# Patient Record
Sex: Female | Born: 1960 | Race: White | Hispanic: No | Marital: Married | Smoking: Former smoker
Health system: Southern US, Community
[De-identification: ages and names within clinical notes are randomized; demographics above are authoritative.]

## PROBLEM LIST (undated history)

## (undated) DIAGNOSIS — E0590 Thyrotoxicosis, unspecified without thyrotoxic crisis or storm: Secondary | ICD-10-CM

## (undated) DIAGNOSIS — E785 Hyperlipidemia, unspecified: Secondary | ICD-10-CM

## (undated) HISTORY — PX: BUNIONECTOMY: SHX129

## (undated) HISTORY — DX: Hyperlipidemia, unspecified: E78.5

## (undated) HISTORY — DX: Thyrotoxicosis, unspecified without thyrotoxic crisis or storm: E05.90

---

## 1999-01-31 ENCOUNTER — Other Ambulatory Visit: Admission: RE | Admit: 1999-01-31 | Discharge: 1999-01-31 | Payer: Self-pay | Admitting: Obstetrics and Gynecology

## 1999-02-13 ENCOUNTER — Other Ambulatory Visit: Admission: RE | Admit: 1999-02-13 | Discharge: 1999-02-13 | Payer: Self-pay | Admitting: Obstetrics and Gynecology

## 1999-05-31 ENCOUNTER — Other Ambulatory Visit: Admission: RE | Admit: 1999-05-31 | Discharge: 1999-05-31 | Payer: Self-pay | Admitting: Obstetrics and Gynecology

## 1999-08-31 ENCOUNTER — Other Ambulatory Visit: Admission: RE | Admit: 1999-08-31 | Discharge: 1999-08-31 | Payer: Self-pay | Admitting: Obstetrics and Gynecology

## 1999-11-29 ENCOUNTER — Other Ambulatory Visit: Admission: RE | Admit: 1999-11-29 | Discharge: 1999-11-29 | Payer: Self-pay | Admitting: Obstetrics and Gynecology

## 2000-06-03 ENCOUNTER — Other Ambulatory Visit: Admission: RE | Admit: 2000-06-03 | Discharge: 2000-06-03 | Payer: Self-pay | Admitting: Obstetrics and Gynecology

## 2000-12-02 ENCOUNTER — Other Ambulatory Visit: Admission: RE | Admit: 2000-12-02 | Discharge: 2000-12-02 | Payer: Self-pay | Admitting: Obstetrics and Gynecology

## 2001-12-03 ENCOUNTER — Other Ambulatory Visit: Admission: RE | Admit: 2001-12-03 | Discharge: 2001-12-03 | Payer: Self-pay | Admitting: Obstetrics and Gynecology

## 2006-06-13 ENCOUNTER — Encounter: Admission: RE | Admit: 2006-06-13 | Discharge: 2006-06-13 | Payer: Self-pay | Admitting: Emergency Medicine

## 2009-06-13 ENCOUNTER — Encounter: Admission: RE | Admit: 2009-06-13 | Discharge: 2009-06-13 | Payer: Self-pay | Admitting: Emergency Medicine

## 2009-11-20 ENCOUNTER — Encounter: Admission: RE | Admit: 2009-11-20 | Discharge: 2009-11-20 | Payer: Self-pay | Admitting: Family Medicine

## 2012-08-10 ENCOUNTER — Other Ambulatory Visit: Payer: Self-pay | Admitting: Gastroenterology

## 2012-08-10 ENCOUNTER — Ambulatory Visit
Admission: RE | Admit: 2012-08-10 | Discharge: 2012-08-10 | Disposition: A | Discharge status: Home / Self Care | Payer: 59 | Source: Ambulatory Visit | Attending: Gastroenterology | Admitting: Gastroenterology

## 2012-08-10 DIAGNOSIS — Q438 Other specified congenital malformations of intestine: Secondary | ICD-10-CM

## 2016-03-05 ENCOUNTER — Encounter: Payer: Self-pay | Admitting: Endocrinology

## 2016-03-05 ENCOUNTER — Ambulatory Visit (INDEPENDENT_AMBULATORY_CARE_PROVIDER_SITE_OTHER): Payer: 59 | Admitting: Endocrinology

## 2016-03-05 VITALS — BP 118/70 | HR 62 | Temp 98.1°F | Ht 62.0 in | Wt 191.5 lb

## 2016-03-05 DIAGNOSIS — E0590 Thyrotoxicosis, unspecified without thyrotoxic crisis or storm: Secondary | ICD-10-CM | POA: Diagnosis not present

## 2016-03-05 DIAGNOSIS — E785 Hyperlipidemia, unspecified: Secondary | ICD-10-CM

## 2016-03-05 NOTE — Progress Notes (Signed)
Pre visit review using our clinic review tool, if applicable. No additional management support is needed unless otherwise documented below in the visit note. 

## 2016-03-05 NOTE — Progress Notes (Signed)
Subjective:    Patient ID: Amy Cowan, female    DOB: [birth year], 55 y.o.   MRN: [id number]  HPI Pt was noted on routine labs in early 2017, to have a suppressed TSH (she says it was normal in 2015).  she has no previous thyroid hx.  she has never had XRT to the anterior neck, or thyroid surgery.  she has never had thyroid imaging.  she does not consume kelp or any other prescribed or non-prescribed thyroid medication.  she has never been on amiodarone.  She reports slight arthralgias throughout the body, and assoc fatigue.  She has had TL. Past Medical History  Diagnosis Date  . Hyperthyroidism   . Dyslipidemia     No past surgical history on file.  Social History   Social History  . Marital Status: Single    Spouse Name: N/A  . Number of Children: N/A  . Years of Education: N/A   Occupational History  . Not on file.   Social History Main Topics  . Smoking status: Not on file  . Smokeless tobacco: Not on file  . Alcohol Use: Not on file  . Drug Use: Not on file  . Sexual Activity: Not on file   Other Topics Concern  . Not on file   Social History Narrative    No current outpatient prescriptions on file prior to visit.   No current facility-administered medications on file prior to visit.    Not on File  Family History  Problem Relation Age of Onset  . Thyroid disease Son   . Thyroid disease Mother   . Thyroid disease Father     BP 118/70 mmHg  Pulse 62  Temp(Src) 98.1 F (36.7 C) (Oral)  Ht 5\' 2"  (1.575 m)  Wt 191 lb 8 oz (86.864 kg)  BMI 35.02 kg/m2  SpO2 95%  Review of Systems denies weight loss, headache, hoarseness, visual loss, palpitations, sob, diarrhea, polyuria, muscle weakness, tremor, anxiety, and rhinorrhea.  She has dry skin, easy bruising, and heat intolerance.      Objective:   Physical Exam VS: see vs page GEN: no distress HEAD: head: no deformity eyes: no periorbital swelling, no proptosis external nose and ears are  normal mouth: no lesion seen NECK: supple, thyroid is slightly and diffusely enlarged CHEST WALL: no deformity LUNGS: clear to auscultation CV: reg rate and rhythm, no murmur ABD: abdomen is soft, nontender.  no hepatosplenomegaly.  not distended.  no hernia MUSCULOSKELETAL: muscle bulk and strength are grossly normal.  no obvious joint swelling.  gait is normal and steady EXTEMITIES: no deformity.  no ulcer on the feet.  feet are of normal color and temp.  Trace bilat leg edema, and vv's. PULSES: dorsalis pedis intact bilat.  no carotid bruit NEURO:  cn 2-12 grossly intact.   readily moves all 4's.  sensation is intact to touch on the feet.  No tremor. SKIN:  Normal texture and temperature.  No rash or suspicious lesion is visible.   NODES:  None palpable at the neck PSYCH: alert, well-oriented.  Does not appear anxious nor depressed.  I have reviewed outside records, and summarized: Pt was noted to have right knee pain, but she did not report any thyroid sxs  outside test results are reviewed: TSH=0.13 and 0.02 Free T4=1.06  CXR (2011): no mention is made of a goiter    Assessment & Plan:  Hyperthyroidism, new, prob due to Grave's dz.  Patient is advised the following:  Patient Instructions  Please consider the treatment options we discussed, and let us know. If you take the radioactive iodine, it works like this: We would first check a thyroid "scan" (a special, but easy and painless type of thyroid x ray).  you go to the x-ray department of the hospital to swallow a pill, which contains a miniscule amount of radiation.  You will not notice any symptoms from this.  You will go back to the x-ray department the next day, to lie down in front of a camera.  The results of this will be sent to me.   Based on the results, i hope to order for you a treatment pill of radioactive iodine.  Although it is a larger amount of radiation, you will again notice no symptoms from this.  The pill is  gone from your body in a few days (during which you should stay away from other people), but takes several months to work.  Therefore, please return here approximately 6-8 weeks after the treatment.  This treatment has been available for many years, and the only known side-effect is an underactive thyroid.  It is possible that i would eventually prescribe for you a thyroid hormone pill, which is very inexpensive.  You don't have to worry about side-effects of this thyroid hormone pill, because it is the same molecule your thyroid makes.      Hyperthyroidism Hyperthyroidism is when the thyroid is too active (overactive). Your thyroid is a large gland that is located in your neck. The thyroid helps to control how your body uses food (metabolism). When your thyroid is overactive, it produces too much of a hormone called thyroxine.  CAUSES Causes of hyperthyroidism may include:  Graves disease. This is when your immune system attacks the thyroid gland. This is the most common cause.  Inflammation of the thyroid gland.  Tumor in the thyroid gland or somewhere else.  Excessive use of thyroid medicines, including:  Prescription thyroid supplement.  Herbal supplements that mimic thyroid hormones.  Solid or fluid-filled lumps within your thyroid gland (thyroid nodules).  Excessive ingestion of iodine. RISK FACTORS  Being female.  Having a family history of thyroid conditions. SIGNS AND SYMPTOMS Signs and symptoms of hyperthyroidism may include:  Nervousness.  Inability to tolerate heat.  Unexplained weight loss.  Diarrhea.  Change in the texture of hair or skin.  Heart skipping beats or making extra beats.  Rapid heart rate.  Loss of menstruation.  Shaky hands.  Fatigue.  Restlessness.  Increased appetite.  Sleep problems.  Enlarged thyroid gland or nodules. DIAGNOSIS  Diagnosis of hyperthyroidism may include:  Medical history and physical exam.  Blood  tests.  Ultrasound tests. TREATMENT Treatment may include:  Medicines to control your thyroid.  Surgery to remove your thyroid.  Radiation therapy. HOME CARE INSTRUCTIONS   Take medicines only as directed by your health care provider.  Do not use any tobacco products, including cigarettes, chewing tobacco, or electronic cigarettes. If you need help quitting, ask your health care provider.  Do not exercise or do physical activity until your health care provider approves.  Keep all follow-up appointments as directed by your health care provider. This is important. SEEK MEDICAL CARE IF:  Your symptoms do not get better with treatment.  You have fever.  You are taking thyroid replacement medicine and you:  Have depression.  Feel mentally and physically slow.  Have weight gain. SEEK IMMEDIATE MEDICAL CARE IF:   You have decreased alertness or  a change in your awareness.  You have abdominal pain.  You feel dizzy.  You have a rapid heartbeat.  You have an irregular heartbeat.   This information is not intended to replace advice given to you by your health care provider. Make sure you discuss any questions you have with your health care provider.   Document Released: 09/09/2005 Document Revised: 09/30/2014 Document Reviewed: 01/25/2014 Elsevier Interactive Patient Education 2016 Elsevier Inc.    Renato Shin, MD

## 2016-03-05 NOTE — Patient Instructions (Addendum)
Please consider the treatment options we discussed, and let us know. If you take the radioactive iodine, it works like this: We would first check a thyroid "scan" (a special, but easy and painless type of thyroid x ray).  you go to the x-ray department of the hospital to swallow a pill, which contains a miniscule amount of radiation.  You will not notice any symptoms from this.  You will go back to the x-ray department the next day, to lie down in front of a camera.  The results of this will be sent to me.   Based on the results, i hope to order for you a treatment pill of radioactive iodine.  Although it is a larger amount of radiation, you will again notice no symptoms from this.  The pill is gone from your body in a few days (during which you should stay away from other people), but takes several months to work.  Therefore, please return here approximately 6-8 weeks after the treatment.  This treatment has been available for many years, and the only known side-effect is an underactive thyroid.  It is possible that i would eventually prescribe for you a thyroid hormone pill, which is very inexpensive.  You don't have to worry about side-effects of this thyroid hormone pill, because it is the same molecule your thyroid makes.      Hyperthyroidism Hyperthyroidism is when the thyroid is too active (overactive). Your thyroid is a large gland that is located in your neck. The thyroid helps to control how your body uses food (metabolism). When your thyroid is overactive, it produces too much of a hormone called thyroxine.  CAUSES Causes of hyperthyroidism may include:  Graves disease. This is when your immune system attacks the thyroid gland. This is the most common cause.  Inflammation of the thyroid gland.  Tumor in the thyroid gland or somewhere else.  Excessive use of thyroid medicines, including:  Prescription thyroid supplement.  Herbal supplements that mimic thyroid hormones.  Solid or  fluid-filled lumps within your thyroid gland (thyroid nodules).  Excessive ingestion of iodine. RISK FACTORS  Being female.  Having a family history of thyroid conditions. SIGNS AND SYMPTOMS Signs and symptoms of hyperthyroidism may include:  Nervousness.  Inability to tolerate heat.  Unexplained weight loss.  Diarrhea.  Change in the texture of hair or skin.  Heart skipping beats or making extra beats.  Rapid heart rate.  Loss of menstruation.  Shaky hands.  Fatigue.  Restlessness.  Increased appetite.  Sleep problems.  Enlarged thyroid gland or nodules. DIAGNOSIS  Diagnosis of hyperthyroidism may include:  Medical history and physical exam.  Blood tests.  Ultrasound tests. TREATMENT Treatment may include:  Medicines to control your thyroid.  Surgery to remove your thyroid.  Radiation therapy. HOME CARE INSTRUCTIONS   Take medicines only as directed by your health care provider.  Do not use any tobacco products, including cigarettes, chewing tobacco, or electronic cigarettes. If you need help quitting, ask your health care provider.  Do not exercise or do physical activity until your health care provider approves.  Keep all follow-up appointments as directed by your health care provider. This is important. SEEK MEDICAL CARE IF:  Your symptoms do not get better with treatment.  You have fever.  You are taking thyroid replacement medicine and you:  Have depression.  Feel mentally and physically slow.  Have weight gain. SEEK IMMEDIATE MEDICAL CARE IF:   You have decreased alertness or a change in your  awareness.  You have abdominal pain.  You feel dizzy.  You have a rapid heartbeat.  You have an irregular heartbeat.   This information is not intended to replace advice given to you by your health care provider. Make sure you discuss any questions you have with your health care provider.   Document Released: 09/09/2005 Document  Revised: 09/30/2014 Document Reviewed: 01/25/2014 Elsevier Interactive Patient Education [employer].

## 2016-03-06 ENCOUNTER — Encounter: Payer: Self-pay | Admitting: Endocrinology

## 2016-03-06 DIAGNOSIS — E785 Hyperlipidemia, unspecified: Secondary | ICD-10-CM | POA: Insufficient documentation

## 2016-05-05 ENCOUNTER — Telehealth: Payer: Self-pay | Admitting: Endocrinology

## 2016-05-05 NOTE — Telephone Encounter (Signed)
please call patient: Dr Dorthy Cooler sent me your blood test results.  The thyroid is still high.  Do you want to take the one-time radioactive iodine pill, or the daily pill to slow the thyroid/  Please let us know.

## 2016-05-06 NOTE — Telephone Encounter (Signed)
Requested a call back from the pt to discuss.  

## 2016-05-07 MED ORDER — METHIMAZOLE 10 MG PO TABS: 10.0000 mg | ORAL_TABLET | Freq: Every day | ORAL | 2 refills | Status: DC

## 2016-05-07 NOTE — Telephone Encounter (Signed)
Ok, I have sent a prescription to your pharmacy If ever you have fever while taking methimazole, stop it and call us, even if the reason is obvious, because of the risk of a rare side-effect. Please come back for a follow-up appointment in 1 month.

## 2016-05-07 NOTE — Telephone Encounter (Signed)
I contacted the pt and advised of note below. She states she is not having any symptoms and has been feeling ok and would like to start the daily medication for right now. Please advise, Thanks!

## 2016-05-08 NOTE — Telephone Encounter (Signed)
I contacted the pt and advised of MD's instructions. Requested a call back if the pt would like to discuss.

## 2016-06-10 ENCOUNTER — Encounter: Payer: Self-pay | Admitting: Endocrinology

## 2016-06-10 ENCOUNTER — Ambulatory Visit (INDEPENDENT_AMBULATORY_CARE_PROVIDER_SITE_OTHER): Payer: 59 | Admitting: Endocrinology

## 2016-06-10 ENCOUNTER — Other Ambulatory Visit (INDEPENDENT_AMBULATORY_CARE_PROVIDER_SITE_OTHER): Payer: 59

## 2016-06-10 VITALS — BP 112/70 | HR 74 | Ht 62.0 in | Wt 190.0 lb

## 2016-06-10 DIAGNOSIS — E0590 Thyrotoxicosis, unspecified without thyrotoxic crisis or storm: Secondary | ICD-10-CM | POA: Diagnosis not present

## 2016-06-10 LAB — TSH: TSH: 0.04 u[IU]/mL — AB (ref 0.35–4.50)

## 2016-06-10 LAB — T4, FREE: FREE T4: 0.78 ng/dL (ref 0.60–1.60)

## 2016-06-10 MED ORDER — METHIMAZOLE 10 MG PO TABS: 10.0000 mg | ORAL_TABLET | Freq: Two times a day (BID) | ORAL | 2 refills | Status: DC

## 2016-06-10 NOTE — Patient Instructions (Addendum)
blood tests are requested for you today.  We'll let you know about the results.   If ever you have fever while taking methimazole, stop it and call us, even if the reason is obvious, because of the risk of a rare side-effect.  Please come back for a follow-up appointment in 2 months.   

## 2016-06-10 NOTE — Progress Notes (Signed)
   Subjective:    Patient ID: Amy Cowan, female    DOB: 1960-10-07, 55 y.o.   MRN: [id number]  HPI Pt returns for f/u of hyperthyroidism (dx'ed 2017 (she says it was normal in 2015); she has never had thyroid imaging, but Grave's Dx is presumed; she choose tapazole over RAI).  pt states she feels well in general.  She says she takes tapazole as rx'ed.  Past Medical History:  Diagnosis Date  . Dyslipidemia   . Hyperthyroidism     No past surgical history on file.  Social History   Social History  . Marital status: Single    Spouse name: N/A  . Number of children: N/A  . Years of education: N/A   Occupational History  . Not on file.   Social History Main Topics  . Smoking status: Unknown If Ever Smoked  . Smokeless tobacco: Not on file  . Alcohol use Not on file  . Drug use: Unknown  . Sexual activity: Not on file   Other Topics Concern  . Not on file   Social History Narrative  . No narrative on file    Current Outpatient Prescriptions on File Prior to Visit  Medication Sig Dispense Refill  . simvastatin (ZOCOR) 10 MG tablet TAKE 1 TABLET IN THE EVENING ONCE A DAY ORALLY  1   No current facility-administered medications on file prior to visit.     Not on File  Family History  Problem Relation Age of Onset  . Thyroid disease Son   . Thyroid disease Mother   . Thyroid disease Father     BP 112/70   Pulse 74   Ht 5\' 2"  (1.575 m)   Wt 190 lb (86.2 kg)   SpO2 97%   BMI 34.75 kg/m    Review of Systems Denies fever.      Objective:   Physical Exam VITAL SIGNS:  See vs page GENERAL: no distress NECK: There is no palpable thyroid enlargement.  No thyroid nodule is palpable.  No palpable lymphadenopathy at the anterior neck.    Lab Results  Component Value Date   TSH 0.04 (L) 06/10/2016      Assessment & Plan:  Hyperthyroid: she needs increased rx.  I have sent a prescription to your pharmacy, to increase synthroid.

## 2016-06-13 ENCOUNTER — Other Ambulatory Visit: Payer: Self-pay | Admitting: Orthopaedic Surgery

## 2016-06-13 DIAGNOSIS — M25561 Pain in right knee: Secondary | ICD-10-CM

## 2016-06-19 ENCOUNTER — Ambulatory Visit
Admission: RE | Admit: 2016-06-19 | Discharge: 2016-06-19 | Disposition: A | Discharge status: Home / Self Care | Payer: 59 | Source: Ambulatory Visit | Attending: Orthopaedic Surgery | Admitting: Orthopaedic Surgery

## 2016-06-19 DIAGNOSIS — M25561 Pain in right knee: Secondary | ICD-10-CM

## 2016-08-11 NOTE — Progress Notes (Signed)
   Subjective:    Patient ID: Amy Cowan, female    DOB: 1961/07/04, 55 y.o.   MRN: [id number]  HPI Pt returns for f/u of hyperthyroidism (dx'ed 2017 (she says it was normal in 2015); she has never had thyroid imaging, but Grave's Dx is presumed; she choose tapazole over RAI).  pt states she feels well in general.  She says she takes tapazole as rx'ed.  Past Medical History:  Diagnosis Date  . Dyslipidemia   . Hyperthyroidism     No past surgical history on file.  Social History   Social History  . Marital status: Single    Spouse name: N/A  . Number of children: N/A  . Years of education: N/A   Occupational History  . Not on file.   Social History Main Topics  . Smoking status: Unknown If Ever Smoked  . Smokeless tobacco: Not on file  . Alcohol use Not on file  . Drug use: Unknown  . Sexual activity: Not on file   Other Topics Concern  . Not on file   Social History Narrative  . No narrative on file    Current Outpatient Prescriptions on File Prior to Visit  Medication Sig Dispense Refill  . methimazole (TAPAZOLE) 10 MG tablet Take 1 tablet (10 mg total) by mouth 2 (two) times daily. 60 tablet 2  . simvastatin (ZOCOR) 10 MG tablet TAKE 1 TABLET IN THE EVENING ONCE A DAY ORALLY  1   No current facility-administered medications on file prior to visit.     Not on File  Family History  Problem Relation Age of Onset  . Thyroid disease Son   . Thyroid disease Mother   . Thyroid disease Father     BP 132/84   Pulse 81   Ht 5\' 2"  (1.575 m)   Wt 196 lb (88.9 kg)   SpO2 96%   BMI 35.85 kg/m    Review of Systems Denies fever.  She has gained a few lbs.      Objective:   Physical Exam VITAL SIGNS:  See vs page GENERAL: no distress Pulses: dorsalis pedis intact bilat.   MSK: no deformity of the feet CV: no leg edema Skin:  no ulcer on the feet.  normal color and temp on the feet. Neuro: sensation is intact to touch on the feet.       Assessment  & Plan:  Hyperthyroidism, due for recheck.  same medication pending result.  Patient is advised the following: Patient Instructions  blood tests are requested for you today.  We'll let you know about the results. If ever you have fever while taking methimazole, stop it and call us, even if the reason is obvious, because of the risk of a rare side-effect. Please come back for a follow-up appointment in 3 months.

## 2016-08-12 ENCOUNTER — Ambulatory Visit (INDEPENDENT_AMBULATORY_CARE_PROVIDER_SITE_OTHER): Payer: 59 | Admitting: Endocrinology

## 2016-08-12 ENCOUNTER — Other Ambulatory Visit: Payer: Self-pay | Admitting: Endocrinology

## 2016-08-12 ENCOUNTER — Encounter: Payer: Self-pay | Admitting: Endocrinology

## 2016-08-12 VITALS — BP 132/84 | HR 81 | Ht 62.0 in | Wt 196.0 lb

## 2016-08-12 DIAGNOSIS — E0590 Thyrotoxicosis, unspecified without thyrotoxic crisis or storm: Secondary | ICD-10-CM

## 2016-08-12 LAB — TSH: TSH: 7.44 u[IU]/mL — AB (ref 0.35–4.50)

## 2016-08-12 LAB — T4, FREE: FREE T4: 0.56 ng/dL — AB (ref 0.60–1.60)

## 2016-08-12 MED ORDER — METHIMAZOLE 5 MG PO TABS: 5.0000 mg | ORAL_TABLET | Freq: Three times a day (TID) | ORAL | 5 refills | Status: DC

## 2016-08-12 NOTE — Addendum Note (Signed)
Addended by: Kaylyn Lim I on: 08/12/2016 08:05 AM   Modules accepted: Orders

## 2016-08-12 NOTE — Patient Instructions (Signed)
blood tests are requested for you today.  We'll let you know about the results. If ever you have fever while taking methimazole, stop it and call us, even if the reason is obvious, because of the risk of a rare side-effect.  Please come back for a follow-up appointment in 3 months.   

## 2016-08-14 ENCOUNTER — Telehealth: Payer: Self-pay | Admitting: Endocrinology

## 2016-08-14 NOTE — Telephone Encounter (Signed)
Patient asked why did you reduce me mg's on her medication methimazole, from 10 to  5  Please advise

## 2016-08-14 NOTE — Telephone Encounter (Signed)
Patient requested her lab result to mailed to her

## 2016-08-14 NOTE — Telephone Encounter (Signed)
I contacted the patient and advised of result note form 08/12/2016 via voicemial. Requested a call back if the patient would like to discuss further.  please call patient: We need to slightly reduce the methimazole. Please take 5 mg 3 times per day. If you miss a dose, take 2 the next time. I'll see you next time.

## 2016-10-22 DIAGNOSIS — D1801 Hemangioma of skin and subcutaneous tissue: Secondary | ICD-10-CM | POA: Diagnosis not present

## 2016-10-22 DIAGNOSIS — L821 Other seborrheic keratosis: Secondary | ICD-10-CM | POA: Diagnosis not present

## 2016-10-22 DIAGNOSIS — L820 Inflamed seborrheic keratosis: Secondary | ICD-10-CM | POA: Diagnosis not present

## 2016-10-22 DIAGNOSIS — D225 Melanocytic nevi of trunk: Secondary | ICD-10-CM | POA: Diagnosis not present

## 2016-11-01 DIAGNOSIS — E0590 Thyrotoxicosis, unspecified without thyrotoxic crisis or storm: Secondary | ICD-10-CM | POA: Diagnosis not present

## 2016-11-04 DIAGNOSIS — E0590 Thyrotoxicosis, unspecified without thyrotoxic crisis or storm: Secondary | ICD-10-CM | POA: Diagnosis not present

## 2016-11-19 ENCOUNTER — Ambulatory Visit: Payer: 59 | Admitting: Endocrinology

## 2016-12-25 DIAGNOSIS — J019 Acute sinusitis, unspecified: Secondary | ICD-10-CM | POA: Diagnosis not present

## 2017-01-16 DIAGNOSIS — E0590 Thyrotoxicosis, unspecified without thyrotoxic crisis or storm: Secondary | ICD-10-CM | POA: Diagnosis not present

## 2017-01-20 DIAGNOSIS — E0590 Thyrotoxicosis, unspecified without thyrotoxic crisis or storm: Secondary | ICD-10-CM | POA: Diagnosis not present

## 2017-03-04 DIAGNOSIS — Z1231 Encounter for screening mammogram for malignant neoplasm of breast: Secondary | ICD-10-CM | POA: Diagnosis not present

## 2017-03-19 DIAGNOSIS — Z124 Encounter for screening for malignant neoplasm of cervix: Secondary | ICD-10-CM | POA: Diagnosis not present

## 2017-03-19 DIAGNOSIS — Z1389 Encounter for screening for other disorder: Secondary | ICD-10-CM | POA: Diagnosis not present

## 2017-03-19 DIAGNOSIS — Z130 Encounter for screening for diseases of the blood and blood-forming organs and certain disorders involving the immune mechanism: Secondary | ICD-10-CM | POA: Diagnosis not present

## 2017-03-19 DIAGNOSIS — Z01419 Encounter for gynecological examination (general) (routine) without abnormal findings: Secondary | ICD-10-CM | POA: Diagnosis not present

## 2017-03-20 DIAGNOSIS — Z01419 Encounter for gynecological examination (general) (routine) without abnormal findings: Secondary | ICD-10-CM | POA: Diagnosis not present

## 2017-03-20 DIAGNOSIS — Z124 Encounter for screening for malignant neoplasm of cervix: Secondary | ICD-10-CM | POA: Diagnosis not present

## 2017-03-23 DIAGNOSIS — Z01419 Encounter for gynecological examination (general) (routine) without abnormal findings: Secondary | ICD-10-CM | POA: Diagnosis not present

## 2017-05-02 DIAGNOSIS — E0590 Thyrotoxicosis, unspecified without thyrotoxic crisis or storm: Secondary | ICD-10-CM | POA: Diagnosis not present

## 2017-05-30 DIAGNOSIS — Z0000 Encounter for general adult medical examination without abnormal findings: Secondary | ICD-10-CM | POA: Diagnosis not present

## 2017-07-22 ENCOUNTER — Ambulatory Visit (INDEPENDENT_AMBULATORY_CARE_PROVIDER_SITE_OTHER): Payer: 59 | Admitting: Orthopaedic Surgery

## 2017-07-22 ENCOUNTER — Encounter (INDEPENDENT_AMBULATORY_CARE_PROVIDER_SITE_OTHER): Payer: Self-pay

## 2017-07-22 ENCOUNTER — Encounter (INDEPENDENT_AMBULATORY_CARE_PROVIDER_SITE_OTHER): Payer: Self-pay | Admitting: Orthopaedic Surgery

## 2017-07-22 ENCOUNTER — Ambulatory Visit (INDEPENDENT_AMBULATORY_CARE_PROVIDER_SITE_OTHER): Payer: 59

## 2017-07-22 VITALS — BP 108/66 | HR 82 | Resp 12 | Ht 61.5 in | Wt 185.0 lb

## 2017-07-22 DIAGNOSIS — M25562 Pain in left knee: Secondary | ICD-10-CM

## 2017-07-22 DIAGNOSIS — G8929 Other chronic pain: Secondary | ICD-10-CM

## 2017-07-22 DIAGNOSIS — E0590 Thyrotoxicosis, unspecified without thyrotoxic crisis or storm: Secondary | ICD-10-CM | POA: Diagnosis not present

## 2017-07-22 NOTE — Progress Notes (Signed)
Office Visit Note   Patient: Amy Cowan           Date of Birth: 08-Aug-1961           MRN: [id number] Visit Date: 07/22/2017              Requested by: Lujean Amel, MD [address] [place], [place] [address] PCP: Lujean Amel, MD   Assessment & Plan: Visit Diagnoses:  1. Chronic pain of left knee     Plan: Mild to moderate osteoarthritis left knee. Has had similar findings on the right knee that responded to physical therapy. Given that we have planned physical therapy in [facility] for her left knee and have her return as needed. Can consider cortisone injection, NSAIDs and Visco supplementation  Follow-Up Instructions: Return if symptoms worsen or fail to improve.   Orders:  Orders Placed This Encounter  Procedures  . XR KNEE 3 VIEW LEFT  . Ambulatory referral to Physical Therapy   No orders of the defined types were placed in this encounter.     Procedures: No procedures performed   Clinical Data: No additional findings.   Subjective: Chief Complaint  Patient presents with  . Right Knee - Pain, Weakness    Amy Cowan is a 56 y o that is here for Left knee pain x 3 weeks. Yesterday her whole left side   Was swollen from Left knee to ankle.  Prior history of osteoarthritis right knee that had a poor response to cortisone but a good response to physical therapy. Amy Cowan feels like she has a similar problem on the left. No history of injury or trauma. The left knee is stiff and sore and achy predominantly along the lateral compartment no history of injury or trauma .presently having a problem in the left knee to the point of activity compromise. No numbness or tingling.  HPI  Review of Systems  Constitutional: Negative for chills, fatigue and fever.  Eyes: Negative for itching.  Respiratory: Negative for chest tightness and shortness of breath.   Cardiovascular: Negative for chest pain, palpitations and leg swelling.    Gastrointestinal: Negative for blood in stool, constipation and diarrhea.  Endocrine: Negative for polyuria.  Genitourinary: Positive for frequency. Negative for dysuria.  Musculoskeletal: Positive for joint swelling. Negative for back pain, neck pain and neck stiffness.  Skin: Positive for rash.  Allergic/Immunologic: Negative for immunocompromised state.  Neurological: Negative for dizziness and numbness.  Hematological: Bruises/bleeds easily.  Psychiatric/Behavioral: Positive for sleep disturbance. The patient is not nervous/anxious.      Objective: Vital Signs: BP 108/66   Pulse 82   Resp 12   Ht 5' 1.5" (1.562 m)   Wt 185 lb (83.9 kg)   LMP 07/10/2012   BMI 34.39 kg/m   Physical Exam  Ortho Exam awake alert and oriented 3. Comfortable sitting. Full extension left knee with flexion over 100 no instability. Predominantly lateral joint pain that was somewhat mild. No effusion. No popliteal pain. No calf pain. Neurovascular exam intact distally. Some patella crepitation and pain with compression. No distal edema. Neurovascular exam intact  Specialty Comments:  No specialty comments available.  Imaging: Xr Knee 3 View Left  Result Date: 07/22/2017 Films of the left knee obtained in 3 projections standing. There is some irregularity along the medial compartment both femur and tibia. Joint space is maintained. Normal alignment. No ectopic calcification. Mild osteophyte formation about the medial patellofemoral joint with normal spacing. Films are  consistent with a mild to moderate osteoarthritis left knee    PMFS History: Patient Active Problem List   Diagnosis Date Noted  . Dyslipidemia   . Hyperthyroidism    Past Medical History:  Diagnosis Date  . Dyslipidemia   . Hyperthyroidism     Family History  Problem Relation Age of Onset  . Thyroid disease Son   . Thyroid disease Mother   . Thyroid disease Father     No past surgical history on file. Social History    Occupational History  . Not on file.   Social History Main Topics  . Smoking status: Light Tobacco Smoker  . Smokeless tobacco: Never Used  . Alcohol use Yes     Comment: occ  . Drug use: No  . Sexual activity: Not on file

## 2017-07-30 DIAGNOSIS — G8929 Other chronic pain: Secondary | ICD-10-CM | POA: Diagnosis not present

## 2017-07-30 DIAGNOSIS — M25562 Pain in left knee: Secondary | ICD-10-CM | POA: Diagnosis not present

## 2017-08-01 DIAGNOSIS — G8929 Other chronic pain: Secondary | ICD-10-CM | POA: Diagnosis not present

## 2017-08-01 DIAGNOSIS — M25562 Pain in left knee: Secondary | ICD-10-CM | POA: Diagnosis not present

## 2017-08-04 DIAGNOSIS — M25562 Pain in left knee: Secondary | ICD-10-CM | POA: Diagnosis not present

## 2017-08-04 DIAGNOSIS — G8929 Other chronic pain: Secondary | ICD-10-CM | POA: Diagnosis not present

## 2017-08-06 DIAGNOSIS — M25562 Pain in left knee: Secondary | ICD-10-CM | POA: Diagnosis not present

## 2017-08-06 DIAGNOSIS — G8929 Other chronic pain: Secondary | ICD-10-CM | POA: Diagnosis not present

## 2017-08-11 DIAGNOSIS — M25562 Pain in left knee: Secondary | ICD-10-CM | POA: Diagnosis not present

## 2017-08-11 DIAGNOSIS — G8929 Other chronic pain: Secondary | ICD-10-CM | POA: Diagnosis not present

## 2017-08-13 DIAGNOSIS — G8929 Other chronic pain: Secondary | ICD-10-CM | POA: Diagnosis not present

## 2017-08-13 DIAGNOSIS — M25562 Pain in left knee: Secondary | ICD-10-CM | POA: Diagnosis not present

## 2017-08-20 DIAGNOSIS — M25562 Pain in left knee: Secondary | ICD-10-CM | POA: Diagnosis not present

## 2017-08-20 DIAGNOSIS — G8929 Other chronic pain: Secondary | ICD-10-CM | POA: Diagnosis not present

## 2017-08-23 DIAGNOSIS — G8929 Other chronic pain: Secondary | ICD-10-CM | POA: Diagnosis not present

## 2017-08-23 DIAGNOSIS — M25562 Pain in left knee: Secondary | ICD-10-CM | POA: Diagnosis not present

## 2017-08-27 DIAGNOSIS — G8929 Other chronic pain: Secondary | ICD-10-CM | POA: Diagnosis not present

## 2017-08-27 DIAGNOSIS — M25562 Pain in left knee: Secondary | ICD-10-CM | POA: Diagnosis not present

## 2017-08-30 DIAGNOSIS — G8929 Other chronic pain: Secondary | ICD-10-CM | POA: Diagnosis not present

## 2017-08-30 DIAGNOSIS — M25562 Pain in left knee: Secondary | ICD-10-CM | POA: Diagnosis not present

## 2017-09-05 DIAGNOSIS — G8929 Other chronic pain: Secondary | ICD-10-CM | POA: Diagnosis not present

## 2017-09-05 DIAGNOSIS — M25562 Pain in left knee: Secondary | ICD-10-CM | POA: Diagnosis not present

## 2017-10-21 DIAGNOSIS — E0590 Thyrotoxicosis, unspecified without thyrotoxic crisis or storm: Secondary | ICD-10-CM | POA: Diagnosis not present

## 2017-10-22 DIAGNOSIS — E7800 Pure hypercholesterolemia, unspecified: Secondary | ICD-10-CM | POA: Diagnosis not present

## 2017-10-22 DIAGNOSIS — Z0000 Encounter for general adult medical examination without abnormal findings: Secondary | ICD-10-CM | POA: Diagnosis not present

## 2017-12-29 DIAGNOSIS — D2239 Melanocytic nevi of other parts of face: Secondary | ICD-10-CM | POA: Diagnosis not present

## 2017-12-29 DIAGNOSIS — D485 Neoplasm of uncertain behavior of skin: Secondary | ICD-10-CM | POA: Diagnosis not present

## 2017-12-29 DIAGNOSIS — L918 Other hypertrophic disorders of the skin: Secondary | ICD-10-CM | POA: Diagnosis not present

## 2017-12-29 DIAGNOSIS — D224 Melanocytic nevi of scalp and neck: Secondary | ICD-10-CM | POA: Diagnosis not present

## 2017-12-29 DIAGNOSIS — L570 Actinic keratosis: Secondary | ICD-10-CM | POA: Diagnosis not present

## 2017-12-29 DIAGNOSIS — L821 Other seborrheic keratosis: Secondary | ICD-10-CM | POA: Diagnosis not present

## 2018-01-13 DIAGNOSIS — E0590 Thyrotoxicosis, unspecified without thyrotoxic crisis or storm: Secondary | ICD-10-CM | POA: Diagnosis not present

## 2018-01-19 DIAGNOSIS — E0590 Thyrotoxicosis, unspecified without thyrotoxic crisis or storm: Secondary | ICD-10-CM | POA: Diagnosis not present

## 2018-03-10 DIAGNOSIS — Z1231 Encounter for screening mammogram for malignant neoplasm of breast: Secondary | ICD-10-CM | POA: Diagnosis not present

## 2018-03-30 DIAGNOSIS — Z1389 Encounter for screening for other disorder: Secondary | ICD-10-CM | POA: Diagnosis not present

## 2018-03-30 DIAGNOSIS — Z01419 Encounter for gynecological examination (general) (routine) without abnormal findings: Secondary | ICD-10-CM | POA: Diagnosis not present

## 2018-03-30 DIAGNOSIS — Z130 Encounter for screening for diseases of the blood and blood-forming organs and certain disorders involving the immune mechanism: Secondary | ICD-10-CM | POA: Diagnosis not present

## 2018-04-17 DIAGNOSIS — Z01419 Encounter for gynecological examination (general) (routine) without abnormal findings: Secondary | ICD-10-CM | POA: Diagnosis not present

## 2018-04-20 DIAGNOSIS — E0590 Thyrotoxicosis, unspecified without thyrotoxic crisis or storm: Secondary | ICD-10-CM | POA: Diagnosis not present

## 2018-05-04 DIAGNOSIS — H1013 Acute atopic conjunctivitis, bilateral: Secondary | ICD-10-CM | POA: Diagnosis not present

## 2018-06-15 DIAGNOSIS — Z23 Encounter for immunization: Secondary | ICD-10-CM | POA: Diagnosis not present

## 2018-07-17 DIAGNOSIS — H1013 Acute atopic conjunctivitis, bilateral: Secondary | ICD-10-CM | POA: Diagnosis not present

## 2018-07-20 DIAGNOSIS — E0590 Thyrotoxicosis, unspecified without thyrotoxic crisis or storm: Secondary | ICD-10-CM | POA: Diagnosis not present

## 2018-08-05 ENCOUNTER — Encounter: Payer: Self-pay | Admitting: Podiatry

## 2018-08-05 ENCOUNTER — Ambulatory Visit (INDEPENDENT_AMBULATORY_CARE_PROVIDER_SITE_OTHER): Payer: 59 | Admitting: Podiatry

## 2018-08-05 VITALS — BP 119/70 | HR 93

## 2018-08-05 DIAGNOSIS — N920 Excessive and frequent menstruation with regular cycle: Secondary | ICD-10-CM | POA: Insufficient documentation

## 2018-08-05 DIAGNOSIS — M79675 Pain in left toe(s): Secondary | ICD-10-CM

## 2018-08-05 DIAGNOSIS — D649 Anemia, unspecified: Secondary | ICD-10-CM | POA: Insufficient documentation

## 2018-08-05 DIAGNOSIS — N816 Rectocele: Secondary | ICD-10-CM | POA: Insufficient documentation

## 2018-08-05 DIAGNOSIS — M79674 Pain in right toe(s): Secondary | ICD-10-CM | POA: Diagnosis not present

## 2018-08-05 DIAGNOSIS — B353 Tinea pedis: Secondary | ICD-10-CM | POA: Diagnosis not present

## 2018-08-05 DIAGNOSIS — B351 Tinea unguium: Secondary | ICD-10-CM | POA: Diagnosis not present

## 2018-08-05 MED ORDER — KETOCONAZOLE 2 % EX CREA: 1.0000 "application " | TOPICAL_CREAM | Freq: Every day | CUTANEOUS | 1 refills | Status: DC

## 2018-08-05 NOTE — Patient Instructions (Addendum)
Onychomycosis/Fungal Toenails  WHAT IS IT? An infection that lies within the keratin of your nail plate that is caused by a fungus.  WHY ME? Fungal infections affect all ages, sexes, races, and creeds.  There may be many factors that predispose you to a fungal infection such as age, coexisting medical conditions such as diabetes, or an autoimmune disease; stress, medications, fatigue, genetics, etc.  Bottom line: fungus thrives in a warm, moist environment and your shoes offer such a location.  IS IT CONTAGIOUS? Theoretically, yes.  You do not want to share shoes, nail clippers or files with someone who has fungal toenails.  Walking around barefoot in the same room or sleeping in the same bed is unlikely to transfer the organism.  It is important to realize, however, that fungus can spread easily from one nail to the next on the same foot.  HOW DO WE TREAT THIS?  There are several ways to treat this condition.  Treatment may depend on many factors such as age, medications, pregnancy, liver and kidney conditions, etc.  It is best to ask your doctor which options are available to you.  1. No treatment.   Unlike many other medical concerns, you can live with this condition.  However for many people this can be a painful condition and may lead to ingrown toenails or a bacterial infection.  It is recommended that you keep the nails cut short to help reduce the amount of fungal nail. 2. Topical treatment.  These range from herbal remedies to prescription strength nail lacquers.  About 40-50% effective, topicals require twice daily application for approximately 9 to 12 months or until an entirely new nail has grown out.  The most effective topicals are medical grade medications available through physicians offices. 3. Oral antifungal medications.  With an 80-90% cure rate, the most common oral medication requires 3 to 4 months of therapy and stays in your system for a year as the new nail grows out.  Oral  antifungal medications do require blood work to make sure it is a safe drug for you.  A liver function panel will be performed prior to starting the medication and after the first month of treatment.  It is important to have the blood work performed to avoid any harmful side effects.  In general, this medication safe but blood work is required. 4. Laser Therapy.  This treatment is performed by applying a specialized laser to the affected nail plate.  This therapy is noninvasive, fast, and non-painful.  It is not covered by insurance and is therefore, out of pocket.  The results have been very good with a 80-95% cure rate.  [facility] is the only practice in the area to offer this therapy. Permanent Nail Avulsion.  Removing the entire nail so that a new nail will not grow back. Athlete's Foot Athlete's foot (tinea pedis) is a fungal infection of the skin on the feet. It often occurs on the skin that is between or underneath the toes. It can also occur on the soles of the feet. The infection can spread from person to person (is contagious). Follow these instructions at home:  Apply or take over-the-counter and prescription medicines only as told by your doctor.  Keep all follow-up visits as told by your doctor. This is important.  Do not scratch your feet.  Keep your feet dry: ? Wear cotton or wool socks. Change your socks every day or if they become wet. ? Wear shoes  that allow air to move around, such as sandals or canvas tennis shoes.  Wash and dry your feet: ? Every day or as told by your doctor. ? After exercising. ? Including the area between your toes.  Wear sandals in wet areas, such as locker rooms and shared showers.  Do not share any of these items: ? Towels. ? Nail clippers. ? Other personal items that touch your feet.  If you have diabetes, keep your blood sugar under control. Contact a doctor if:  You have a fever.  You have swelling, soreness, warmth, or  redness in your foot.  You are not getting better with treatment.  Your symptoms get worse.  You have new symptoms. This information is not intended to replace advice given to you by your health care provider. Make sure you discuss any questions you have with your health care provider. Document Released: 02/26/2008 Document Revised: 02/15/2016 Document Reviewed: 03/13/2015 Elsevier Interactive Patient Education  [employer]. 5.

## 2018-08-19 ENCOUNTER — Ambulatory Visit: Payer: 59 | Admitting: Podiatry

## 2018-08-29 ENCOUNTER — Encounter: Payer: Self-pay | Admitting: Podiatry

## 2018-08-29 NOTE — Progress Notes (Signed)
Subjective: "I have a fungus on my toe, possibly 2 toes and athlete's feet on one foot." Amy Cowan presents today referred by Lujean Amel, MD with cc of painful, discolored, thick toenails which interfere with daily activities. Duration greater than 1 month.  Pain is aggravated when wearing enclosed shoe gear.  She is also concerned about peeling and odor from her left foot for several months. She has tried over the counter creams which have not worked.  Past Medical History:  Diagnosis Date  . Dyslipidemia   . Hyperthyroidism     Patient Active Problem List   Diagnosis Date Noted  . Anemia 08/05/2018  . 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 Menorrhagia 08/05/2018  . Dyslipidemia   . Hyperthyroidism    Past Surgical History:  Procedure Laterality Date  . BUNIONECTOMY Right     Current Outpatient Medications:  .  methimazole (TAPAZOLE) 5 MG tablet, Take 1 tablet (5 mg total) by mouth 3 (three) times daily., Disp: 90 tablet, Rfl: 5 .  neomycin-polymyxin b-dexamethasone (MAXITROL) 3.5-10000-0.1 SUSP, INSTILL ONE DROP BY OPHTHALMIC ROUTE FOUR TIMES EVERY DAY INTO AFFECTED EYE(S), Disp: , Rfl: 0 .  simvastatin (ZOCOR) 20 MG tablet, every evening., Disp: , Rfl: 3 .  ketoconazole (NIZORAL) 2 % cream, Apply 1 application topically daily. Apply to both feet and between toes once daily for 6 weeks, Disp: 30 g, Rfl: 1 .  simvastatin (ZOCOR) 10 MG tablet, TAKE 1 TABLET IN THE EVENING ONCE A DAY ORALLY, Disp: , Rfl: 1  No Known Allergies  Social History   Occupational History  . Not on file  Tobacco Use  . Smoking status: Light Tobacco Smoker  . Smokeless tobacco: Never Used  Substance and Sexual Activity  . Alcohol use: Yes    Comment: occ  . Drug use: No  . Sexual activity: Not on file   Family History  Problem Relation Age of Onset  . Thyroid disease Son   . Thyroid disease Mother   . Lung cancer Mother   . Thyroid disease Father   . COPD Father    . Diabetes Brother      There is no immunization history on file for this patient.  Review of systems: Positive Findings in bold print.  Constitutional:  chills, fatigue, fever, sweats, weight change Communication: Optometrist, sign Ecologist, hand writing, iPad/Android device Eyes: diplopia, glare,  light sensitivity, eyeglasses, blindness Ears nose mouth throat: Hard of hearing, deaf, sign language,  vertigo,   bloody nose,  rhinitis,  cold sores, snoring Cardiovascular: HTN, edema, arrhythmia, pacemaker in place, defibrillator in place,  chest pain/tightness, chronic anticoagulation, blood clot Respiratory:  difficulty breathing, denies congestion, SOB, wheezing, cough Gastrointestinal: abdominal pain, diarrhea, nausea, vomiting,  Genitourinary:  nocturia,  pain on urination,  blood in urine, Foley catheter, urinary urgency Musculoskeletal: Uses mobility aid,  cramping, stiff joints, painful joints,  Skin: +changes in toenails, color change, dryness, itchy skin, mole changes, or skin eruption left foot Neurological: numbness, paresthesias, burning in feet, denies fainting,  seizure, change in speech. denies headaches, memory problems/poor historian, cerebral palsy Endocrine: diabetes, hypothyroidism, hyperthyroidism,  dry mouth, flushing, denies heat intolerance,  cold intolerance,  excessive thirst, denies polyuria,  nocturia Hematological:  easy bleeding,  excessive bleeding, easy bruising, enlarged lymph nodes, on long term blood thinner Allergy/immunological:  hive, frequent infections, multiple drug allergies, seasonal allergies,  Psychiatric:  anxiety, depression, mood disorder, suicidal ideations, hallucinations   Objective: Vascular Examination: Capillary refill time immediate  x 10 digits Dorsalis pedis and posterior tibial pulses present b/l No digital hair x 10 digits Skin temperature gradient WNL b/l  Dermatological Examination: Skin with normal turgor and  tone b/l  Scaling and petechiae noted along plantar aspect of right foot with no blisters, no weeping. Interdigital spaces clear b/l.  Toenails 1-5 b/l discolored, thick, dystrophic with subungual debris and pain with palpation to nailbeds due to thickness of nails.  Musculoskeletal: Muscle strength 5/5 to all LE muscle groups  Neurological: Sensation intact with 10 gram monofilament Vibratory sensation intact.  Assessment: 1. Painful onychomycosis toenails 1-5 b/l  2. Tinea pedis right foot   Plan: 1. Discussed onychomycosis and treatment options.  Literature dispensed on today. For tinea pedis, prescription sent to pharmacy for Ketoconazole Cream 2% to be applied to both feet and between toes qd x 6 weeks. 2. Toenails 1-5 b/l were debrided in length and girth without iatrogenic bleeding. 3. Patient to continue soft, supportive shoe gear 4. Patient to report any pedal injuries to medical professional immediately. 5. Follow up 3 months. Patient/POA to call should there be a concern in the interim.

## 2018-10-14 DIAGNOSIS — E0590 Thyrotoxicosis, unspecified without thyrotoxic crisis or storm: Secondary | ICD-10-CM | POA: Diagnosis not present

## 2018-11-02 DIAGNOSIS — Z0000 Encounter for general adult medical examination without abnormal findings: Secondary | ICD-10-CM | POA: Diagnosis not present

## 2018-11-02 DIAGNOSIS — E7800 Pure hypercholesterolemia, unspecified: Secondary | ICD-10-CM | POA: Diagnosis not present

## 2018-11-02 DIAGNOSIS — R7303 Prediabetes: Secondary | ICD-10-CM | POA: Diagnosis not present

## 2018-11-11 ENCOUNTER — Ambulatory Visit: Payer: BLUE CROSS/BLUE SHIELD | Admitting: Podiatry

## 2018-11-11 ENCOUNTER — Encounter: Payer: Self-pay | Admitting: Podiatry

## 2018-11-11 DIAGNOSIS — M79674 Pain in right toe(s): Secondary | ICD-10-CM

## 2018-11-11 DIAGNOSIS — M79675 Pain in left toe(s): Secondary | ICD-10-CM | POA: Diagnosis not present

## 2018-11-11 DIAGNOSIS — B351 Tinea unguium: Secondary | ICD-10-CM | POA: Diagnosis not present

## 2018-11-11 DIAGNOSIS — B353 Tinea pedis: Secondary | ICD-10-CM

## 2018-11-11 NOTE — Patient Instructions (Signed)

## 2018-11-11 NOTE — Progress Notes (Signed)
Subjective: Amy Cowan presents today with painful, thick toenails 1-5 b/l that she cannot cut and which interfere with daily activities.  Pain is aggravated when wearing enclosed shoe gear.  Patient states she used ketoconazole and notices an improvement in her skin.  She states she has not noticed an improvement in her toenails.  Koirala, Dibas, MD is her PCP.   Current Outpatient Medications:  .  ketoconazole (NIZORAL) 2 % cream, Apply 1 application topically daily. Apply to both feet and between toes once daily for 6 weeks, Disp: 30 g, Rfl: 1 .  methimazole (TAPAZOLE) 5 MG tablet, Take 1 tablet (5 mg total) by mouth 3 (three) times daily., Disp: 90 tablet, Rfl: 5 .  neomycin-polymyxin b-dexamethasone (MAXITROL) 3.5-10000-0.1 SUSP, INSTILL ONE DROP BY OPHTHALMIC ROUTE FOUR TIMES EVERY DAY INTO AFFECTED EYE(S), Disp: , Rfl: 0 .  simvastatin (ZOCOR) 10 MG tablet, TAKE 1 TABLET IN THE EVENING ONCE A DAY ORALLY, Disp: , Rfl: 1 .  simvastatin (ZOCOR) 20 MG tablet, every evening., Disp: , Rfl: 3  No Known Allergies  Objective:  Vascular Examination: Capillary refill time immediate x 10 digits  Dorsalis pedis and Posterior tibial pulses palpable b/l  Digital hair absent x 10 digits  Skin temperature gradient WNL b/l  Dermatological Examination: Skin with normal turgor, texture and tone b/l  Toenails 1-5 b/l discolored, thick, dystrophic with subungual debris and pain with palpation to nailbeds due to thickness of nails.  Musculoskeletal: Muscle strength 5/5 to all LE muscle groups  No pain, crepitus or joint limitation noted with ROM.   Neurological: Sensation intact with 10 gram monofilament.  Vibratory sensation intact.  Assessment: Painful onychomycosis toenails 1-5 b/l  Tinea pedis bilaterally  Plan: 1. Toenails 1-5 b/l were debrided in length and girth without iatrogenic bleeding.  Discussed treatment options for onychomycosis.  We discussed topical treatment,  oral Lamisil treatment, and laser therapy.  Patient is interested in laser therapy.  I reviewed the procedure for laser therapy with the patient.  She will schedule an appointment with Marylou Mccoy at her convenience. 2. Patient to continue soft, supportive shoe gear 3. Patient to report any pedal injuries to medical professional immediately. 4. Follow up with me 3 months.  5. Patient/POA to call should there be a concern in the interim.

## 2018-11-24 ENCOUNTER — Ambulatory Visit: Payer: BLUE CROSS/BLUE SHIELD

## 2018-11-24 DIAGNOSIS — B351 Tinea unguium: Secondary | ICD-10-CM

## 2018-11-24 DIAGNOSIS — M79675 Pain in left toe(s): Secondary | ICD-10-CM

## 2018-11-24 DIAGNOSIS — M79674 Pain in right toe(s): Principal | ICD-10-CM

## 2018-11-24 NOTE — Patient Instructions (Signed)

## 2018-11-24 NOTE — Progress Notes (Signed)
Pt presents with mycotic infection of nails 1-5 bilateral.  All other systems are negative  Laser therapy administered to affected nails and tolerated well. All safety precautions were in place.  2nd treatment.  Follow up in 4 weeks     

## 2018-12-25 ENCOUNTER — Ambulatory Visit: Payer: BLUE CROSS/BLUE SHIELD

## 2018-12-25 ENCOUNTER — Other Ambulatory Visit: Payer: Self-pay

## 2018-12-25 ENCOUNTER — Other Ambulatory Visit: Payer: BLUE CROSS/BLUE SHIELD

## 2018-12-25 DIAGNOSIS — M79675 Pain in left toe(s): Secondary | ICD-10-CM

## 2018-12-25 DIAGNOSIS — B351 Tinea unguium: Secondary | ICD-10-CM

## 2018-12-25 DIAGNOSIS — M79674 Pain in right toe(s): Principal | ICD-10-CM

## 2018-12-29 ENCOUNTER — Other Ambulatory Visit: Payer: BLUE CROSS/BLUE SHIELD

## 2019-01-13 DIAGNOSIS — E0590 Thyrotoxicosis, unspecified without thyrotoxic crisis or storm: Secondary | ICD-10-CM | POA: Diagnosis not present

## 2019-01-20 DIAGNOSIS — E0590 Thyrotoxicosis, unspecified without thyrotoxic crisis or storm: Secondary | ICD-10-CM | POA: Diagnosis not present

## 2019-01-21 ENCOUNTER — Ambulatory Visit: Payer: Self-pay

## 2019-01-21 ENCOUNTER — Other Ambulatory Visit: Payer: Self-pay

## 2019-01-21 DIAGNOSIS — M79674 Pain in right toe(s): Principal | ICD-10-CM

## 2019-01-21 DIAGNOSIS — B351 Tinea unguium: Secondary | ICD-10-CM

## 2019-01-21 DIAGNOSIS — M79675 Pain in left toe(s): Secondary | ICD-10-CM

## 2019-01-22 ENCOUNTER — Other Ambulatory Visit: Payer: BLUE CROSS/BLUE SHIELD

## 2019-01-27 NOTE — Progress Notes (Signed)
Pt presents with mycotic infection of nails 1-5 bilateral.  All other systems are negative  Laser therapy administered to affected nails and tolerated well. All safety precautions were in place.  3rd treatment.  Follow up in 4 weeks     

## 2019-01-27 NOTE — Progress Notes (Signed)
Pt presents with mycotic infection of nails 1-5 bilateral  All other systems are negative  Laser therapy administered to affected nails and tolerated well. All safety precautions were in place. 4th treatment.  Follow up in 4 weeks     

## 2019-02-10 ENCOUNTER — Ambulatory Visit: Payer: BLUE CROSS/BLUE SHIELD | Admitting: Podiatry

## 2019-02-22 ENCOUNTER — Ambulatory Visit: Payer: Self-pay | Admitting: Podiatry

## 2019-02-22 ENCOUNTER — Other Ambulatory Visit: Payer: Self-pay

## 2019-02-22 VITALS — Temp 97.5°F

## 2019-02-22 DIAGNOSIS — M79675 Pain in left toe(s): Secondary | ICD-10-CM

## 2019-02-22 DIAGNOSIS — B351 Tinea unguium: Secondary | ICD-10-CM

## 2019-02-22 DIAGNOSIS — M79674 Pain in right toe(s): Secondary | ICD-10-CM

## 2019-02-22 NOTE — Progress Notes (Signed)
Pt presents with mycotic infection of nails 1-5 bilateral  All other systems are negative  Laser therapy administered to affected nails and tolerated well. All safety precautions were in place. 5th treatment.  Patient wanted to follow up with Dr. Elisha Ponder for re-evaluation of toenails. Follow up 4 weeks.  Subjective: Patient noted improvement in appearance of nails with laser therapy. Medical assistant's note reviewed above.  Objective: Neurovascular status intact.  Improvement noted in proximal 1/3 to 1/2 of nailbeds as noted with clearing of fungal debris and visible pink nailbeds.    Continue laser therapy and follow up after last treatment.

## 2019-03-24 ENCOUNTER — Other Ambulatory Visit: Payer: Self-pay

## 2019-03-24 ENCOUNTER — Ambulatory Visit (INDEPENDENT_AMBULATORY_CARE_PROVIDER_SITE_OTHER): Payer: BC Managed Care – PPO | Admitting: Podiatry

## 2019-03-24 ENCOUNTER — Encounter: Payer: Self-pay | Admitting: Podiatry

## 2019-03-24 VITALS — Temp 98.4°F

## 2019-03-24 DIAGNOSIS — M79675 Pain in left toe(s): Secondary | ICD-10-CM

## 2019-03-24 DIAGNOSIS — B351 Tinea unguium: Secondary | ICD-10-CM | POA: Diagnosis not present

## 2019-03-24 DIAGNOSIS — M79674 Pain in right toe(s): Secondary | ICD-10-CM | POA: Diagnosis not present

## 2019-03-24 DIAGNOSIS — B353 Tinea pedis: Secondary | ICD-10-CM | POA: Diagnosis not present

## 2019-03-28 ENCOUNTER — Encounter: Payer: Self-pay | Admitting: Podiatry

## 2019-03-28 NOTE — Progress Notes (Signed)
Subjective:  Amy Cowan presents to clinic today for follow up of tinea pedis and mycotic toenails b/l great toes and right 2nd toe.  She has completed five sessions of laser therapy.  She feels there is noticeable improvement in appearance of her nails.  Koirala, Dibas, MD is her PCP.    Current Outpatient Medications:  .  ketoconazole (NIZORAL) 2 % cream, Apply 1 application topically daily. Apply to both feet and between toes once daily for 6 weeks, Disp: 30 g, Rfl: 1 .  methimazole (TAPAZOLE) 5 MG tablet, Take 1 tablet (5 mg total) by mouth 3 (three) times daily., Disp: 90 tablet, Rfl: 5 .  neomycin-polymyxin b-dexamethasone (MAXITROL) 3.5-10000-0.1 SUSP, INSTILL ONE DROP BY OPHTHALMIC ROUTE FOUR TIMES EVERY DAY INTO AFFECTED EYE(S), Disp: , Rfl: 0 .  simvastatin (ZOCOR) 10 MG tablet, TAKE 1 TABLET IN THE EVENING ONCE A DAY ORALLY, Disp: , Rfl: 1 .  simvastatin (ZOCOR) 20 MG tablet, every evening., Disp: , Rfl: 3   No Known Allergies   Objective: Vitals:   03/24/19 1149  Temp: 98.4 F (36.9 C)    Physical Examination:  Vascular Examination: Capillary refill time immediate x 10 digits.  Palpable DP/PT pulses b/l.  Digital hair absent  b/l.  No edema noted b/l.  Skin temperature gradient WNL b/l.  Dermatological Examination: Skin with normal turgor, texture and tone b/l.  No open wounds b/l.  No interdigital macerations noted b/l.  Noticeable improvement in appearance of toenails 3-5 b/l. There is residual fungal debris distal 1/3 of bilateral great toe nail plates and entire right 2nd digit nail plate.  Resolution of tinea pedis b/l. No interdigital macerations nor scaling.  Musculoskeletal Examination: Muscle strength 5/5 to all muscle groups b/l  No pain, crepitus or joint discomfort with active/passive ROM.  Neurological Examination: Sensation intact 5/5 b/l with 10 gram monofilament.  Vibratory sensation intact b/l.  Proprioceptive sensation  intact b/l.  Assessment: Mycotic nail infection with pain bilateral great toes and right 2nd digit Resolved tinea pedis  Plan: 1. Discussed onychomycosis. Will schedule one additional laser session. I have informed RN and patient will schedule at her convenience. 2.  Continue soft, supportive shoe gear daily. 3.  Report any pedal injuries to medical professional. 4.  Follow up 3 months. 5.  Patient/POA to call should there be a question/concern in there interim.

## 2019-03-30 ENCOUNTER — Ambulatory Visit: Payer: BC Managed Care – PPO

## 2019-03-30 ENCOUNTER — Other Ambulatory Visit: Payer: Self-pay

## 2019-03-30 DIAGNOSIS — M79675 Pain in left toe(s): Secondary | ICD-10-CM

## 2019-03-30 DIAGNOSIS — B351 Tinea unguium: Secondary | ICD-10-CM

## 2019-04-23 NOTE — Progress Notes (Signed)
Pt presents with mycotic infection of nails 1-5 bilateral  All other systems are negative  Laser therapy administered to affected nails and tolerated well. All safety precautions were in place.  5th treatment.  Follow up in 4 weeks

## 2019-04-30 ENCOUNTER — Ambulatory Visit: Payer: BC Managed Care – PPO | Admitting: Podiatry

## 2019-04-30 ENCOUNTER — Other Ambulatory Visit: Payer: Self-pay

## 2019-04-30 DIAGNOSIS — B351 Tinea unguium: Secondary | ICD-10-CM

## 2019-04-30 DIAGNOSIS — M79674 Pain in right toe(s): Secondary | ICD-10-CM

## 2019-04-30 DIAGNOSIS — M79675 Pain in left toe(s): Secondary | ICD-10-CM

## 2019-05-20 DIAGNOSIS — Z1231 Encounter for screening mammogram for malignant neoplasm of breast: Secondary | ICD-10-CM | POA: Diagnosis not present

## 2019-05-28 NOTE — Progress Notes (Signed)
Pt presents with mycotic infection of nails 1-5 bilateral  All other systems are negative  Laser therapy administered to affected nails and tolerated well. All safety precautions were in place.  6th treatment.  Follow up in 4 weeks     

## 2019-07-05 ENCOUNTER — Ambulatory Visit: Payer: BC Managed Care – PPO | Admitting: Podiatry

## 2019-08-12 DIAGNOSIS — E0590 Thyrotoxicosis, unspecified without thyrotoxic crisis or storm: Secondary | ICD-10-CM | POA: Diagnosis not present

## 2019-08-24 DIAGNOSIS — E0590 Thyrotoxicosis, unspecified without thyrotoxic crisis or storm: Secondary | ICD-10-CM | POA: Diagnosis not present

## 2019-09-29 ENCOUNTER — Ambulatory Visit: Payer: BC Managed Care – PPO | Attending: Internal Medicine

## 2019-09-29 DIAGNOSIS — Z20822 Contact with and (suspected) exposure to covid-19: Secondary | ICD-10-CM | POA: Diagnosis not present

## 2019-09-30 LAB — NOVEL CORONAVIRUS, NAA: SARS-CoV-2, NAA: NOT DETECTED

## 2019-11-03 DIAGNOSIS — Z0000 Encounter for general adult medical examination without abnormal findings: Secondary | ICD-10-CM | POA: Diagnosis not present

## 2019-11-03 DIAGNOSIS — R7309 Other abnormal glucose: Secondary | ICD-10-CM | POA: Diagnosis not present

## 2019-11-03 DIAGNOSIS — E7800 Pure hypercholesterolemia, unspecified: Secondary | ICD-10-CM | POA: Diagnosis not present

## 2019-11-26 DIAGNOSIS — Z23 Encounter for immunization: Secondary | ICD-10-CM | POA: Diagnosis not present

## 2020-01-18 DIAGNOSIS — E0590 Thyrotoxicosis, unspecified without thyrotoxic crisis or storm: Secondary | ICD-10-CM | POA: Diagnosis not present

## 2020-02-10 DIAGNOSIS — Z23 Encounter for immunization: Secondary | ICD-10-CM | POA: Diagnosis not present

## 2020-02-22 DIAGNOSIS — E0590 Thyrotoxicosis, unspecified without thyrotoxic crisis or storm: Secondary | ICD-10-CM | POA: Diagnosis not present

## 2020-05-22 DIAGNOSIS — Z1231 Encounter for screening mammogram for malignant neoplasm of breast: Secondary | ICD-10-CM | POA: Diagnosis not present

## 2020-06-01 DIAGNOSIS — R928 Other abnormal and inconclusive findings on diagnostic imaging of breast: Secondary | ICD-10-CM | POA: Diagnosis not present

## 2020-06-01 DIAGNOSIS — N6001 Solitary cyst of right breast: Secondary | ICD-10-CM | POA: Diagnosis not present

## 2020-06-02 DIAGNOSIS — Z1389 Encounter for screening for other disorder: Secondary | ICD-10-CM | POA: Diagnosis not present

## 2020-06-02 DIAGNOSIS — Z01419 Encounter for gynecological examination (general) (routine) without abnormal findings: Secondary | ICD-10-CM | POA: Diagnosis not present

## 2020-06-02 DIAGNOSIS — Z130 Encounter for screening for diseases of the blood and blood-forming organs and certain disorders involving the immune mechanism: Secondary | ICD-10-CM | POA: Diagnosis not present

## 2020-06-02 DIAGNOSIS — E669 Obesity, unspecified: Secondary | ICD-10-CM | POA: Diagnosis not present

## 2020-06-02 DIAGNOSIS — Z124 Encounter for screening for malignant neoplasm of cervix: Secondary | ICD-10-CM | POA: Diagnosis not present

## 2020-06-08 DIAGNOSIS — R768 Other specified abnormal immunological findings in serum: Secondary | ICD-10-CM | POA: Diagnosis not present

## 2020-06-15 DIAGNOSIS — R768 Other specified abnormal immunological findings in serum: Secondary | ICD-10-CM | POA: Diagnosis not present

## 2020-07-05 DIAGNOSIS — N6312 Unspecified lump in the right breast, upper inner quadrant: Secondary | ICD-10-CM | POA: Diagnosis not present

## 2020-08-23 DIAGNOSIS — E0590 Thyrotoxicosis, unspecified without thyrotoxic crisis or storm: Secondary | ICD-10-CM | POA: Diagnosis not present

## 2020-09-19 DIAGNOSIS — H5213 Myopia, bilateral: Secondary | ICD-10-CM | POA: Diagnosis not present

## 2020-09-19 DIAGNOSIS — H10413 Chronic giant papillary conjunctivitis, bilateral: Secondary | ICD-10-CM | POA: Diagnosis not present

## 2020-11-09 DIAGNOSIS — R7303 Prediabetes: Secondary | ICD-10-CM | POA: Diagnosis not present

## 2020-11-09 DIAGNOSIS — R768 Other specified abnormal immunological findings in serum: Secondary | ICD-10-CM | POA: Diagnosis not present

## 2020-11-09 DIAGNOSIS — E7800 Pure hypercholesterolemia, unspecified: Secondary | ICD-10-CM | POA: Diagnosis not present

## 2020-11-09 DIAGNOSIS — Z0000 Encounter for general adult medical examination without abnormal findings: Secondary | ICD-10-CM | POA: Diagnosis not present

## 2020-11-23 ENCOUNTER — Other Ambulatory Visit: Payer: Self-pay | Admitting: *Deleted

## 2020-11-23 DIAGNOSIS — F17210 Nicotine dependence, cigarettes, uncomplicated: Secondary | ICD-10-CM

## 2020-12-25 ENCOUNTER — Ambulatory Visit (INDEPENDENT_AMBULATORY_CARE_PROVIDER_SITE_OTHER): Payer: BC Managed Care – PPO | Admitting: Acute Care

## 2020-12-25 ENCOUNTER — Encounter: Payer: Self-pay | Admitting: Acute Care

## 2020-12-25 ENCOUNTER — Ambulatory Visit
Admission: RE | Admit: 2020-12-25 | Discharge: 2020-12-25 | Disposition: A | Discharge status: Home / Self Care | Payer: BC Managed Care – PPO | Source: Ambulatory Visit | Attending: Acute Care | Admitting: Acute Care

## 2020-12-25 ENCOUNTER — Other Ambulatory Visit: Payer: Self-pay

## 2020-12-25 VITALS — BP 116/72 | HR 68 | Temp 98.1°F | Ht 62.0 in | Wt 176.4 lb

## 2020-12-25 DIAGNOSIS — Z122 Encounter for screening for malignant neoplasm of respiratory organs: Secondary | ICD-10-CM

## 2020-12-25 DIAGNOSIS — J432 Centrilobular emphysema: Secondary | ICD-10-CM | POA: Diagnosis not present

## 2020-12-25 DIAGNOSIS — F17210 Nicotine dependence, cigarettes, uncomplicated: Secondary | ICD-10-CM

## 2020-12-25 DIAGNOSIS — J984 Other disorders of lung: Secondary | ICD-10-CM | POA: Diagnosis not present

## 2020-12-25 DIAGNOSIS — Z87891 Personal history of nicotine dependence: Secondary | ICD-10-CM | POA: Diagnosis not present

## 2020-12-25 DIAGNOSIS — J9811 Atelectasis: Secondary | ICD-10-CM | POA: Diagnosis not present

## 2020-12-25 NOTE — Progress Notes (Signed)
Shared Decision Making Visit Lung Cancer Screening Program [phone])   Eligibility:  Age 60 y.o.  Pack Years Smoking History Calculation 33 pack year smoking history (# packs/per year x # years smoked)  Recent History of coughing up blood  no  Unexplained weight loss? no ( >Than 15 pounds within the last 6 months )  Prior History Lung / other cancer no (Diagnosis within the last 5 years already requiring surveillance chest CT Scans).  Smoking Status Current Smoker Former Smokers: Years since quit:NA   Quit Date: NA  Visit Components:  Discussion included one or more decision making aids. yes  Discussion included risk/benefits of screening. yes  Discussion included potential follow up diagnostic testing for abnormal scans. yes  Discussion included meaning and risk of over diagnosis. yes  Discussion included meaning and risk of False Positives. yes  Discussion included meaning of total radiation exposure. yes  Counseling Included:  Importance of adherence to annual lung cancer LDCT screening. yes  Impact of comorbidities on ability to participate in the program. yes  Ability and willingness to under diagnostic treatment. yes  Smoking Cessation Counseling:  Current Smokers:   Discussed importance of smoking cessation. yes  Information about tobacco cessation classes and interventions provided to patient. yes  Patient provided with "ticket" for LDCT Scan. yes  Symptomatic Patient. no  Counseling  Diagnosis Code: Tobacco Use Z72.0  Asymptomatic Patient yes  Counseling (Intermediate counseling: > three minutes counseling) V6160  Former Smokers:   Discussed the importance of maintaining cigarette abstinence. yes  Diagnosis Code: Personal History of Nicotine Dependence. V37.106  Information about tobacco cessation classes and interventions provided to patient. Yes  Patient provided with "ticket" for LDCT Scan. yes  Written Order for Lung Cancer Screening  with LDCT placed in Epic. Yes (CT Chest Lung Cancer Screening Low Dose W/O CM) [id number] Z12.2-Screening of respiratory organs Z87.891-Personal history of nicotine dependence  BP 116/72 (BP Location: Right Arm, Cuff Size: Normal)   Pulse 68   Temp 98.1 F (36.7 C) (Oral)   Ht 5\' 2"  (1.575 m)   Wt 176 lb 6.4 oz (80 kg)   LMP 07/10/2012   SpO2 95%   BMI 32.26 kg/m    I have spent 25 minutes of face to face time with Ms. Cragin discussing the risks and benefits of lung cancer screening. We viewed a power point together that explained in detail the above noted topics. We paused at intervals to allow for questions to be asked and answered to ensure understanding.We discussed that the single most powerful action that she can take to decrease her risk of developing lung cancer is to quit smoking. We discussed whether or not she is ready to commit to setting a quit date. We discussed options for tools to aid in quitting smoking including nicotine replacement therapy, non-nicotine medications, support groups, Quit Smart classes, and behavior modification. We discussed that often times setting smaller, more achievable goals, such as eliminating 1 cigarette a day for a week and then 2 cigarettes a day for a week can be helpful in slowly decreasing the number of cigarettes smoked. This allows for a sense of accomplishment as well as providing a clinical benefit. I gave her the " Be Stronger Than Your Excuses" card with contact information for community resources, classes, free nicotine replacement therapy, and access to mobile apps, text messaging, and on-line smoking cessation help. I have also given her my card and contact information in the event she needs to contact me.  We discussed the time and location of the scan, and that either Doroteo Glassman RN or I will call with the results within 24-48 hours of receiving them. I have offered her  a copy of the power point we viewed  as a resource in the event they  need reinforcement of the concepts we discussed today in the office. The patient verbalized understanding of all of  the above and had no further questions upon leaving the office. They have my contact information in the event they have any further questions.  I spent 3 minutes counseling on smoking cessation and the health risks of continued tobacco abuse.  I explained to the patient that there has been a high incidence of coronary artery disease noted on these exams. I explained that this is a non-gated exam therefore degree or severity cannot be determined. This patient is  Currently on statin therapy. I have asked the patient to follow-up with their PCP regarding any incidental finding of coronary artery disease and management with diet or medication as their PCP  feels is clinically indicated. The patient verbalized understanding of the above and had no further questions upon completion of the visit.      Magdalen Spatz, NP 12/25/2020

## 2020-12-25 NOTE — Patient Instructions (Signed)
Thank you for participating in the [facility]. It was our pleasure to meet you today. We will call you with the results of your scan within the next few days. Your scan will be assigned a Lung RADS category score by the physicians reading the scans.  This Lung RADS score determines follow up scanning.  See below for description of categories, and follow up screening recommendations. We will be in touch to schedule your follow up screening annually or based on recommendations of our providers. We will fax a copy of your scan results to your Primary Care Physician, or the physician who referred you to the program, to ensure they have the results. Please call the office if you have any questions or concerns regarding your scanning experience or results.  Our office number is [phone]. Please speak with Denise Phelps, RN. She is our Lung Cancer Screening RN. If she is unavailable when you call, please have the office staff send her a message. She will return your call at her earliest convenience. Remember, if your scan is normal, we will scan you annually as long as you continue to meet the criteria for the program. (Age 55-77, Current smoker or smoker who has quit within the last 15 years). If you are a smoker, remember, quitting is the single most powerful action that you can take to decrease your risk of lung cancer and other pulmonary, breathing related problems. We know quitting is hard, and we are here to help.  Please let us know if there is anything we can do to help you meet your goal of quitting. If you are a former smoker, congratulations. We are proud of you! Remain smoke free! Remember you can refer friends or family members through the number above.  We will screen them to make sure they meet criteria for the program. Thank you for helping us take better care of you by participating in Lung Screening.  Lung RADS Categories:  Lung RADS 1: no nodules  or definitely non-concerning nodules.  Recommendation is for a repeat annual scan in 12 months.  Lung RADS 2:  nodules that are non-concerning in appearance and behavior with a very low likelihood of becoming an active cancer. Recommendation is for a repeat annual scan in 12 months.  Lung RADS 3: nodules that are probably non-concerning , includes nodules with a low likelihood of becoming an active cancer.  Recommendation is for a 6-month repeat screening scan. Often noted after an upper respiratory illness. We will be in touch to make sure you have no questions, and to schedule your 6-month scan.  Lung RADS 4 A: nodules with concerning findings, recommendation is most often for a follow up scan in 3 months or additional testing based on our provider's assessment of the scan. We will be in touch to make sure you have no questions and to schedule the recommended 3 month follow up scan.  Lung RADS 4 B:  indicates findings that are concerning. We will be in touch with you to schedule additional diagnostic testing based on our provider's  assessment of the scan.   

## 2021-01-10 DIAGNOSIS — R928 Other abnormal and inconclusive findings on diagnostic imaging of breast: Secondary | ICD-10-CM | POA: Diagnosis not present

## 2021-01-10 DIAGNOSIS — R922 Inconclusive mammogram: Secondary | ICD-10-CM | POA: Diagnosis not present

## 2021-01-11 NOTE — Progress Notes (Signed)
Please call patient and let them  know their  low dose Ct was read as a Lung RADS 1, negative study: no nodules or definitely benign nodules. Radiology recommendation is for a repeat LDCT in 12 months. .Please let them  know we will order and schedule their  annual screening scan for 12/2021. Please let them  know there was notation of CAD on their  scan.  Please remind the patient  that this is a non-gated exam therefore degree or severity of disease  cannot be determined. Please have them  follow up with their PCP regarding potential risk factor modification, dietary therapy or pharmacologic therapy if clinically indicated. Pt.  is currently on statin therapy. Please place order for annual  screening scan for  12/2021 and fax results to PCP. Thanks so much.

## 2021-01-16 ENCOUNTER — Other Ambulatory Visit: Payer: Self-pay | Admitting: *Deleted

## 2021-01-16 DIAGNOSIS — F17210 Nicotine dependence, cigarettes, uncomplicated: Secondary | ICD-10-CM

## 2021-02-22 DIAGNOSIS — E0590 Thyrotoxicosis, unspecified without thyrotoxic crisis or storm: Secondary | ICD-10-CM | POA: Diagnosis not present

## 2021-02-27 DIAGNOSIS — E0590 Thyrotoxicosis, unspecified without thyrotoxic crisis or storm: Secondary | ICD-10-CM | POA: Diagnosis not present

## 2021-04-22 IMAGING — CT CT CHEST LUNG CANCER SCREENING LOW DOSE W/O CM
1 of 2 series · 10 of 20 positions shown, 13 images · non-contrast
Comparison: None.

CLINICAL DATA: 60-year-old female current smoker, with 45 pack-year
history of smoking, for initial lung cancer screening

EXAM:
CT CHEST WITHOUT CONTRAST LOW-DOSE FOR LUNG CANCER SCREENING
TECHNIQUE: Multidetector CT imaging of the chest was performed following the
standard protocol without IV contrast.

[ct lung segmentation data · axial · 0.62mm/px · z∈[-304,-304]mm · 10 of 300 frames shown]
[frame 1/300  mediastinal]
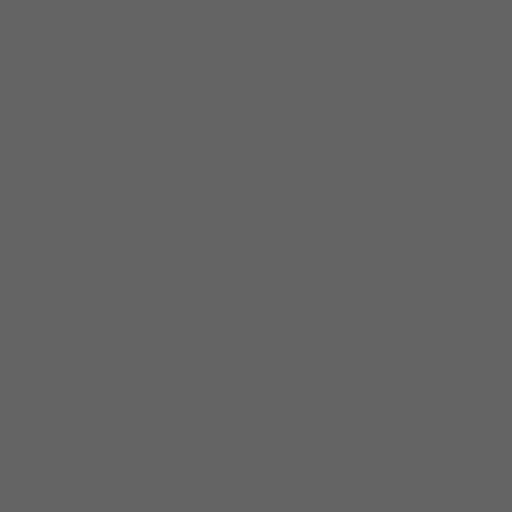
[frame 1/300  lung]
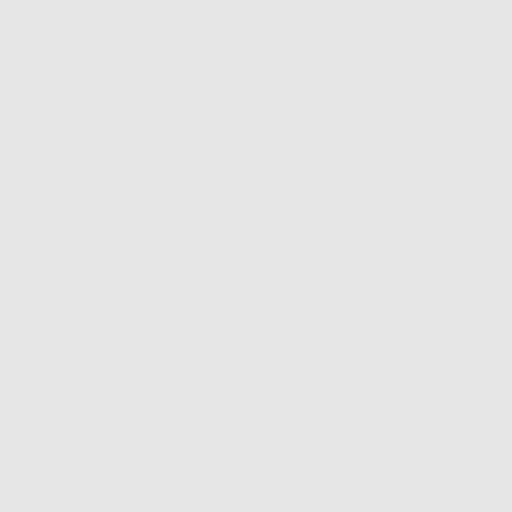
[frame 34/300  lung]
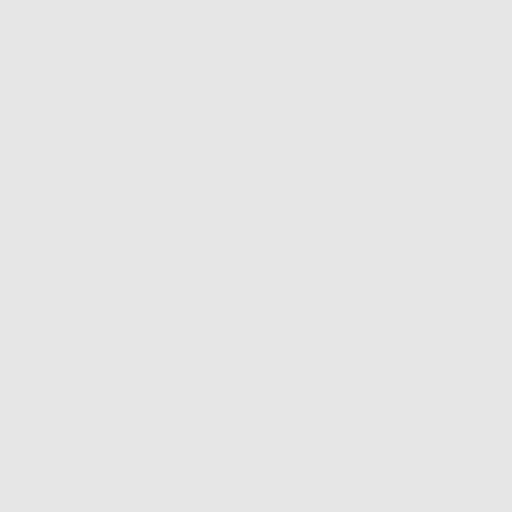
[frame 67/300  lung]
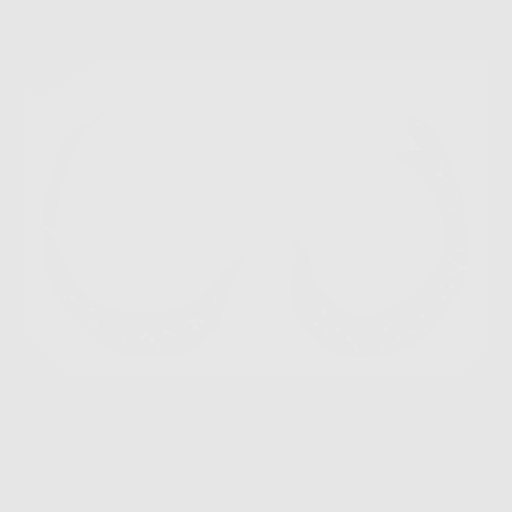
[frame 100/300  lung]
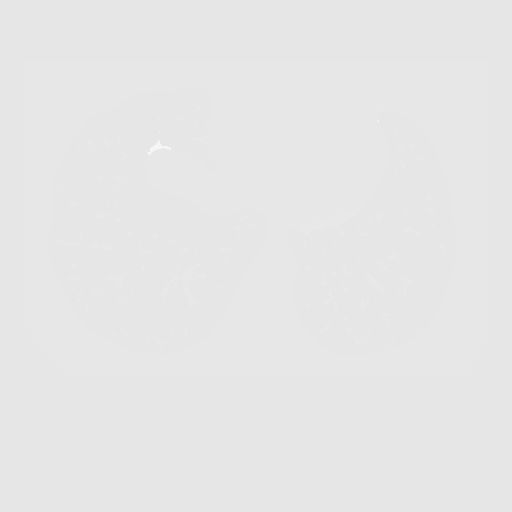
[frame 133/300  mediastinal]
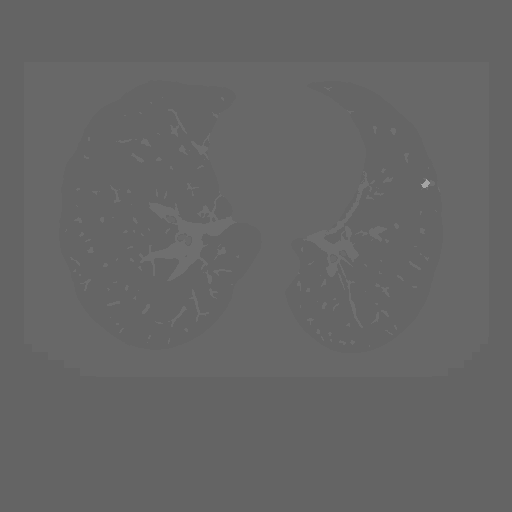
[frame 133/300  lung]
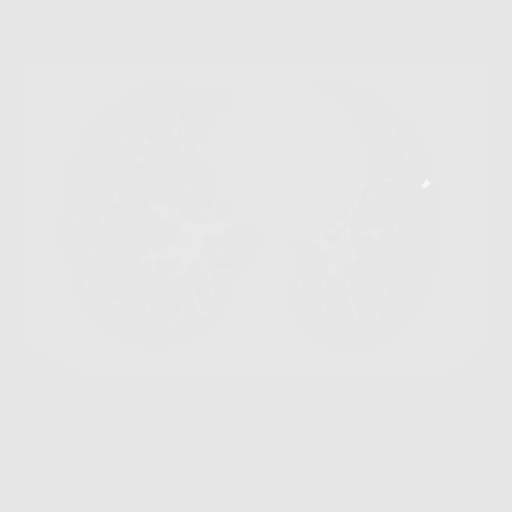
[frame 167/300  lung]
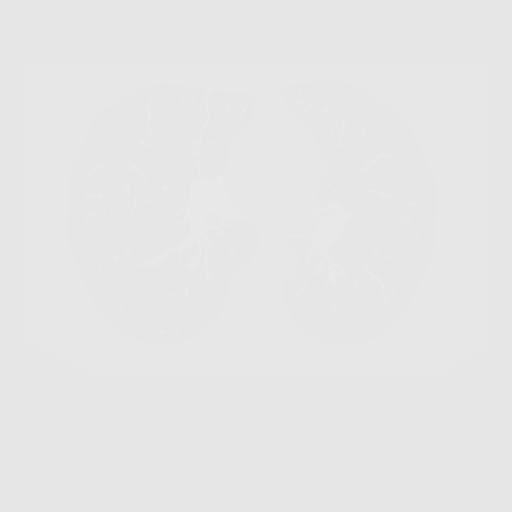
[frame 200/300  lung]
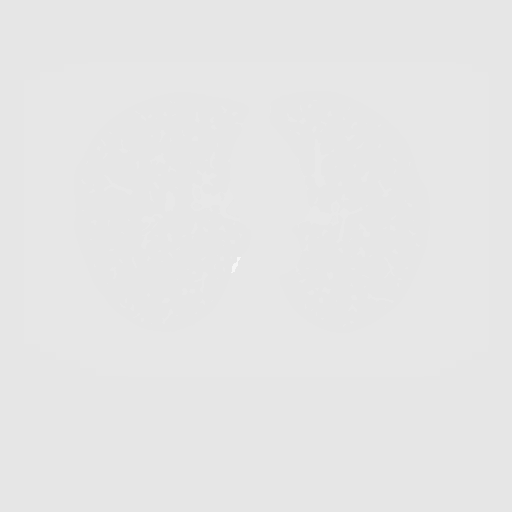
[frame 233/300  lung]
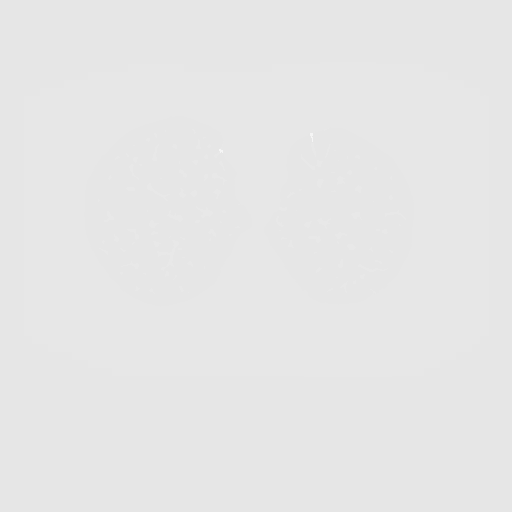
[frame 266/300  mediastinal]
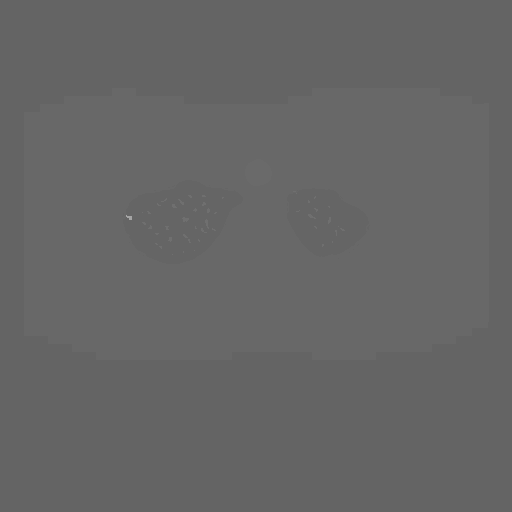
[frame 266/300  lung]
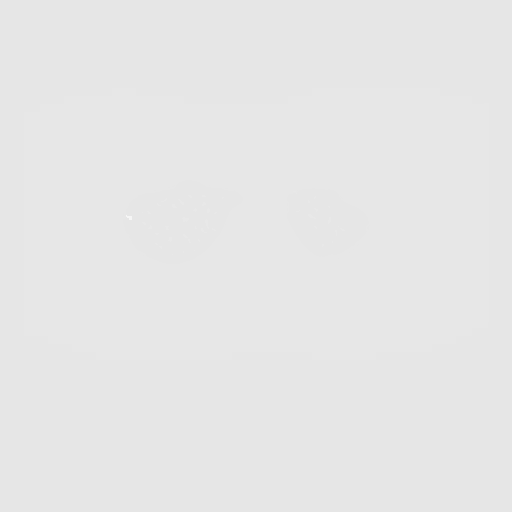
[frame 300/300  lung]
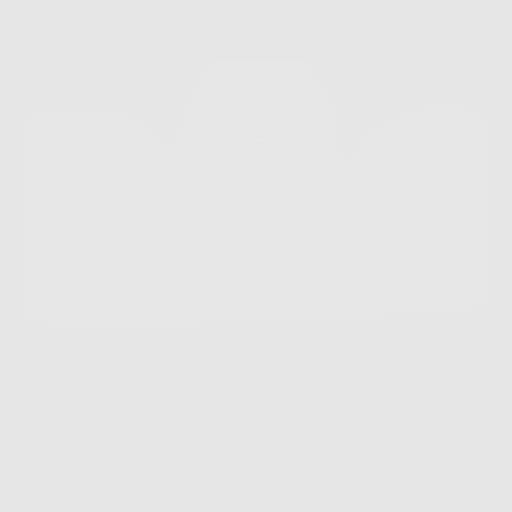

[10 of 20 positions shown; findings below may reference images not displayed]

FINDINGS: Cardiovascular: Heart is normal in size.  No pericardial effusion.

No evidence of thoracic aortic aneurysm.

Mediastinum/Nodes: No suspicious mediastinal lymphadenopathy.

Visualized thyroid is unremarkable.

Lungs/Pleura: Mild biapical pleural-parenchymal scarring.

Mild centrilobular emphysematous changes, upper lung predominant.

Mild linear scarring/atelectasis in the lingula. No focal
consolidation.

No suspicious pulmonary nodules.

No pleural effusion or pneumothorax.

Upper Abdomen: Visualized upper abdomen is notable for
cholelithiasis, without associated inflammatory changes.

Musculoskeletal: Mild superior endplate changes which Schmorl's node
deformity at T12.
IMPRESSION: Lung-RADS 1, negative. Continue annual screening with low-dose chest
CT without contrast in 12 months.

Emphysema ([id number]).

## 2021-04-30 DIAGNOSIS — D1801 Hemangioma of skin and subcutaneous tissue: Secondary | ICD-10-CM | POA: Diagnosis not present

## 2021-04-30 DIAGNOSIS — L821 Other seborrheic keratosis: Secondary | ICD-10-CM | POA: Diagnosis not present

## 2021-04-30 DIAGNOSIS — L814 Other melanin hyperpigmentation: Secondary | ICD-10-CM | POA: Diagnosis not present

## 2021-04-30 DIAGNOSIS — L820 Inflamed seborrheic keratosis: Secondary | ICD-10-CM | POA: Diagnosis not present

## 2021-05-01 DIAGNOSIS — E0590 Thyrotoxicosis, unspecified without thyrotoxic crisis or storm: Secondary | ICD-10-CM | POA: Diagnosis not present

## 2021-06-04 DIAGNOSIS — Z01419 Encounter for gynecological examination (general) (routine) without abnormal findings: Secondary | ICD-10-CM | POA: Diagnosis not present

## 2021-06-04 DIAGNOSIS — Z1389 Encounter for screening for other disorder: Secondary | ICD-10-CM | POA: Diagnosis not present

## 2021-06-04 DIAGNOSIS — Z1231 Encounter for screening mammogram for malignant neoplasm of breast: Secondary | ICD-10-CM | POA: Diagnosis not present

## 2021-06-04 DIAGNOSIS — Z130 Encounter for screening for diseases of the blood and blood-forming organs and certain disorders involving the immune mechanism: Secondary | ICD-10-CM | POA: Diagnosis not present

## 2021-06-04 DIAGNOSIS — E669 Obesity, unspecified: Secondary | ICD-10-CM | POA: Diagnosis not present

## 2021-06-12 DIAGNOSIS — Z01419 Encounter for gynecological examination (general) (routine) without abnormal findings: Secondary | ICD-10-CM | POA: Diagnosis not present

## 2021-06-18 DIAGNOSIS — R208 Other disturbances of skin sensation: Secondary | ICD-10-CM | POA: Diagnosis not present

## 2021-07-17 DIAGNOSIS — U071 COVID-19: Secondary | ICD-10-CM | POA: Diagnosis not present

## 2021-09-20 DIAGNOSIS — H2513 Age-related nuclear cataract, bilateral: Secondary | ICD-10-CM | POA: Diagnosis not present

## 2021-09-20 DIAGNOSIS — H5213 Myopia, bilateral: Secondary | ICD-10-CM | POA: Diagnosis not present

## 2021-10-04 DIAGNOSIS — R1013 Epigastric pain: Secondary | ICD-10-CM | POA: Diagnosis not present

## 2021-10-08 ENCOUNTER — Emergency Department (HOSPITAL_BASED_OUTPATIENT_CLINIC_OR_DEPARTMENT_OTHER): Payer: BC Managed Care – PPO

## 2021-10-08 ENCOUNTER — Encounter (HOSPITAL_BASED_OUTPATIENT_CLINIC_OR_DEPARTMENT_OTHER): Payer: Self-pay

## 2021-10-08 ENCOUNTER — Other Ambulatory Visit: Payer: Self-pay

## 2021-10-08 ENCOUNTER — Observation Stay (HOSPITAL_BASED_OUTPATIENT_CLINIC_OR_DEPARTMENT_OTHER)
Admission: EM | Admit: 2021-10-08 | Discharge: 2021-10-10 | Disposition: A | Discharge status: Home / Self Care | Payer: BC Managed Care – PPO | Attending: Surgery | Admitting: Surgery

## 2021-10-08 DIAGNOSIS — R109 Unspecified abdominal pain: Secondary | ICD-10-CM | POA: Diagnosis not present

## 2021-10-08 DIAGNOSIS — Z20822 Contact with and (suspected) exposure to covid-19: Secondary | ICD-10-CM | POA: Insufficient documentation

## 2021-10-08 DIAGNOSIS — K8012 Calculus of gallbladder with acute and chronic cholecystitis without obstruction: Principal | ICD-10-CM | POA: Insufficient documentation

## 2021-10-08 DIAGNOSIS — F17200 Nicotine dependence, unspecified, uncomplicated: Secondary | ICD-10-CM | POA: Diagnosis not present

## 2021-10-08 DIAGNOSIS — R000 Tachycardia, unspecified: Secondary | ICD-10-CM | POA: Diagnosis not present

## 2021-10-08 DIAGNOSIS — K8020 Calculus of gallbladder without cholecystitis without obstruction: Secondary | ICD-10-CM | POA: Diagnosis not present

## 2021-10-08 DIAGNOSIS — K810 Acute cholecystitis: Secondary | ICD-10-CM | POA: Diagnosis present

## 2021-10-08 DIAGNOSIS — R1011 Right upper quadrant pain: Secondary | ICD-10-CM | POA: Diagnosis not present

## 2021-10-08 DIAGNOSIS — R0789 Other chest pain: Secondary | ICD-10-CM | POA: Diagnosis not present

## 2021-10-08 LAB — URINALYSIS, ROUTINE W REFLEX MICROSCOPIC
Bilirubin Urine: NEGATIVE
Glucose, UA: NEGATIVE mg/dL
Ketones, ur: 15 mg/dL — AB
Leukocytes,Ua: NEGATIVE
Nitrite: NEGATIVE
Protein, ur: NEGATIVE mg/dL
Specific Gravity, Urine: 1.01 (ref 1.005–1.030)
pH: 7 (ref 5.0–8.0)

## 2021-10-08 LAB — COMPREHENSIVE METABOLIC PANEL
ALT: 71 U/L — ABNORMAL HIGH (ref 0–44)
AST: 62 U/L — ABNORMAL HIGH (ref 15–41)
Albumin: 3.9 g/dL (ref 3.5–5.0)
Alkaline Phosphatase: 149 U/L — ABNORMAL HIGH (ref 38–126)
Anion gap: 12 (ref 5–15)
BUN: 12 mg/dL (ref 6–20)
CO2: 23 mmol/L (ref 22–32)
Calcium: 9.2 mg/dL (ref 8.9–10.3)
Chloride: 95 mmol/L — ABNORMAL LOW (ref 98–111)
Creatinine, Ser: 0.58 mg/dL (ref 0.44–1.00)
GFR, Estimated: >60 mL/min (ref >60)
Glucose, Bld: 109 mg/dL — ABNORMAL HIGH (ref 70–99)
Potassium: 4.1 mmol/L (ref 3.5–5.1)
Sodium: 130 mmol/L — ABNORMAL LOW (ref 135–145)
Total Bilirubin: 1.2 mg/dL (ref 0.3–1.2)
Total Protein: 7.7 g/dL (ref 6.5–8.1)

## 2021-10-08 LAB — URINALYSIS, MICROSCOPIC (REFLEX)

## 2021-10-08 LAB — RESP PANEL BY RT-PCR (FLU A&B, COVID) ARPGX2
Influenza A by PCR: NEGATIVE
Influenza B by PCR: NEGATIVE
SARS Coronavirus 2 by RT PCR: NEGATIVE

## 2021-10-08 LAB — SURGICAL PCR SCREEN
MRSA, PCR: NEGATIVE
Staphylococcus aureus: NEGATIVE

## 2021-10-08 LAB — CBC
HCT: 40.2 % (ref 36.0–46.0)
Hemoglobin: 13.9 g/dL (ref 12.0–15.0)
MCH: 29.4 pg (ref 26.0–34.0)
MCHC: 34.6 g/dL (ref 30.0–36.0)
MCV: 85.2 fL (ref 80.0–100.0)
Platelets: 214 10*3/uL (ref 150–400)
RBC: 4.72 MIL/uL (ref 3.87–5.11)
RDW: 12.3 % (ref 11.5–15.5)
WBC: 8.4 10*3/uL (ref 4.0–10.5)
nRBC: 0 % (ref 0.0–0.2)

## 2021-10-08 LAB — PREGNANCY, URINE: Preg Test, Ur: NEGATIVE

## 2021-10-08 LAB — TROPONIN I (HIGH SENSITIVITY): Troponin I (High Sensitivity): 4 ng/L (ref <18)

## 2021-10-08 LAB — OCCULT BLOOD X 1 CARD TO LAB, STOOL: Fecal Occult Bld: NEGATIVE

## 2021-10-08 LAB — LIPASE, BLOOD: Lipase: 25 U/L (ref 11–51)

## 2021-10-08 MED ORDER — METHIMAZOLE 5 MG PO TABS: 5.0000 mg | ORAL_TABLET | Freq: Three times a day (TID) | ORAL | Status: DC

## 2021-10-08 MED ORDER — SODIUM CHLORIDE 0.9 % IV SOLN: 2.0000 g | INTRAVENOUS | Status: DC

## 2021-10-08 MED ORDER — ACETAMINOPHEN 650 MG RE SUPP: 650.0000 mg | Freq: Four times a day (QID) | RECTAL | Status: DC | PRN

## 2021-10-08 MED ORDER — ALUM & MAG HYDROXIDE-SIMETH 200-200-20 MG/5ML PO SUSP
30.0000 mL | Freq: Once | ORAL | Status: AC
  Administered 2021-10-08: 30 mL via ORAL
  Filled 2021-10-08: qty 30

## 2021-10-08 MED ORDER — LIDOCAINE VISCOUS HCL 2 % MT SOLN
15.0000 mL | Freq: Once | OROMUCOSAL | Status: AC
  Administered 2021-10-08: 15 mL via ORAL
  Filled 2021-10-08: qty 15

## 2021-10-08 MED ORDER — METOPROLOL TARTRATE 5 MG/5ML IV SOLN: 5.0000 mg | Freq: Four times a day (QID) | INTRAVENOUS | Status: DC | PRN

## 2021-10-08 MED ORDER — PANTOPRAZOLE SODIUM 40 MG IV SOLR
40.0000 mg | Freq: Every day | INTRAVENOUS | Status: DC
  Administered 2021-10-08 – 2021-10-09 (×2): 40 mg via INTRAVENOUS
  Filled 2021-10-08 (×2): qty 40

## 2021-10-08 MED ORDER — IOHEXOL 300 MG/ML  SOLN
100.0000 mL | Freq: Once | INTRAMUSCULAR | Status: AC | PRN
  Administered 2021-10-08: 100 mL via INTRAVENOUS

## 2021-10-08 MED ORDER — SODIUM CHLORIDE 0.9 % IV SOLN
2.0000 g | INTRAVENOUS | Status: DC
  Administered 2021-10-08: 2 g via INTRAVENOUS
  Filled 2021-10-08 (×6): qty 20

## 2021-10-08 MED ORDER — ONDANSETRON HCL 4 MG/2ML IJ SOLN: 4.0000 mg | Freq: Four times a day (QID) | INTRAMUSCULAR | Status: DC | PRN

## 2021-10-08 MED ORDER — ACETAMINOPHEN 325 MG PO TABS
650.0000 mg | ORAL_TABLET | Freq: Four times a day (QID) | ORAL | Status: DC | PRN
  Administered 2021-10-09: 650 mg via ORAL
  Filled 2021-10-08: qty 2

## 2021-10-08 MED ORDER — SODIUM CHLORIDE 0.9 % IV SOLN: INTRAVENOUS | Status: DC

## 2021-10-08 MED ORDER — ENOXAPARIN SODIUM 40 MG/0.4ML IJ SOSY: 40.0000 mg | PREFILLED_SYRINGE | INTRAMUSCULAR | Status: DC

## 2021-10-08 MED ORDER — ONDANSETRON 4 MG PO TBDP: 4.0000 mg | ORAL_TABLET | Freq: Four times a day (QID) | ORAL | Status: DC | PRN

## 2021-10-08 MED ORDER — SODIUM CHLORIDE 0.9 % IV BOLUS
1000.0000 mL | Freq: Once | INTRAVENOUS | Status: AC
Start: 2021-10-08 — End: 2021-10-08
  Administered 2021-10-08: 1000 mL via INTRAVENOUS

## 2021-10-08 MED ORDER — SIMETHICONE 80 MG PO CHEW
40.0000 mg | CHEWABLE_TABLET | Freq: Four times a day (QID) | ORAL | Status: DC | PRN
Start: 2021-10-08 — End: 2021-10-10
  Filled 2021-10-08: qty 1

## 2021-10-08 MED ORDER — MORPHINE SULFATE (PF) 2 MG/ML IV SOLN
2.0000 mg | INTRAVENOUS | Status: DC | PRN
  Administered 2021-10-08 – 2021-10-09 (×4): 2 mg via INTRAVENOUS
  Filled 2021-10-08 (×4): qty 1

## 2021-10-08 MED ORDER — OXYCODONE HCL 5 MG PO TABS
5.0000 mg | ORAL_TABLET | ORAL | Status: DC | PRN
  Administered 2021-10-08: 5 mg via ORAL
  Administered 2021-10-09 – 2021-10-10 (×2): 10 mg via ORAL
  Filled 2021-10-08: qty 1
  Filled 2021-10-08 (×3): qty 2

## 2021-10-08 MED ORDER — METHOCARBAMOL 500 MG PO TABS: 500.0000 mg | ORAL_TABLET | Freq: Four times a day (QID) | ORAL | Status: DC | PRN

## 2021-10-08 MED ORDER — SODIUM CHLORIDE 0.9% FLUSH
3.0000 mL | Freq: Once | INTRAVENOUS | Status: AC
  Administered 2021-10-08: 3 mL via INTRAVENOUS
  Filled 2021-10-08: qty 3

## 2021-10-08 MED ORDER — POLYETHYLENE GLYCOL 3350 17 G PO PACK: 17.0000 g | PACK | Freq: Every day | ORAL | Status: DC | PRN

## 2021-10-08 MED ORDER — PANTOPRAZOLE SODIUM 40 MG IV SOLR
40.0000 mg | Freq: Once | INTRAVENOUS | Status: AC
Start: 2021-10-08 — End: 2021-10-08
  Administered 2021-10-08: 40 mg via INTRAVENOUS
  Filled 2021-10-08: qty 40

## 2021-10-08 MED ORDER — SODIUM CHLORIDE 0.9 % IV SOLN: 2.0000 g | Freq: Once | INTRAVENOUS | Status: DC

## 2021-10-08 MED ORDER — MUPIROCIN 2 % EX OINT
1.0000 "application " | TOPICAL_OINTMENT | Freq: Two times a day (BID) | CUTANEOUS | Status: DC
  Administered 2021-10-08: 1 via NASAL
  Filled 2021-10-08: qty 22

## 2021-10-08 NOTE — ED Provider Notes (Signed)
[facility] Provider Note   CSN: [id number] Arrival date & time: 10/08/21  1025     History  Chief Complaint  Patient presents with   Abdominal Pain    Amy Cowan is a 61 y.o. female has medical history significant for hypercholesterolemia, thyroid issue, with no history of intra-abdominal surgeries who presents with 2 weeks of abdominal pain is worse after eating.  Patient reports that abdominal pain radiates to her chest, as well as her upper and lower back.  Patient reports that she has been constipated, and has had some dark stools for the last few days.  Patient denies any bright red blood per rectum.  Patient denies any nausea, vomiting.  Patient reports that she was encouraged to take MiraLAX by her primary care doctor which she does not feel like is helping.  Patient reports improvement in pain with Tylenol.  Patient reports abdominal pain is worse after eating, she has been sticking to softer foods.  Patient denies history of alcohol use, chronic NSAID use, history of gastric ulcers.  She does have a history of acid reflux.   Abdominal Pain     Home Medications Prior to Admission medications   Medication Sig Start Date End Date Taking? Authorizing Provider  simvastatin (ZOCOR) 20 MG tablet every evening. 07/26/18  Yes [provider]  cholecalciferol (VITAMIN D3) 25 MCG (1000 UNIT) tablet 1 tablet    [provider]  methimazole (TAPAZOLE) 5 MG tablet Take 1 tablet (5 mg total) by mouth 3 (three) times daily. 08/12/16   Renato Shin, MD      Allergies    Atorvastatin    Review of Systems   Review of Systems  Gastrointestinal:  Positive for abdominal pain.   Physical Exam Updated Vital Signs BP 118/68    Pulse 90    Temp 98.3 F (36.8 C)    Resp (!) 30    Ht '5\' 2"'  (1.575 m)    Wt 81.2 kg    LMP 07/10/2012    SpO2 100%    BMI 32.74 kg/m  Physical Exam  ED Results / Procedures / Treatments   Labs (all labs  ordered are listed, but only abnormal results are displayed) Labs Reviewed  COMPREHENSIVE METABOLIC PANEL - Abnormal; Notable for the following components:      Result Value   Sodium 130 (*)    Chloride 95 (*)    Glucose, Bld 109 (*)    AST 62 (*)    ALT 71 (*)    Alkaline Phosphatase 149 (*)    All other components within normal limits  URINALYSIS, ROUTINE W REFLEX MICROSCOPIC - Abnormal; Notable for the following components:   Hgb urine dipstick TRACE (*)    Ketones, ur 15 (*)    All other components within normal limits  URINALYSIS, MICROSCOPIC (REFLEX) - Abnormal; Notable for the following components:   Bacteria, UA RARE (*)    All other components within normal limits  RESP PANEL BY RT-PCR (FLU A&B, COVID) ARPGX2  LIPASE, BLOOD  CBC  PREGNANCY, URINE  OCCULT BLOOD X 1 CARD TO LAB, STOOL  HIV ANTIBODY (ROUTINE TESTING W REFLEX)  TROPONIN I (HIGH SENSITIVITY)    EKG EKG Interpretation  Date/Time:  Monday October 08 2021 10:55:02 EST Ventricular Rate:  101 PR Interval:  121 QRS Duration: 102 QT Interval:  359 QTC Calculation: 468 R Axis:   21 Text Interpretation: Sinus tachycardia Low voltage, precordial leads Confirmed by Lennice Sites (  [id number]) on 10/08/2021 1:57:33 PM  Radiology DG Chest 2 View  Result Date: 10/08/2021 CLINICAL DATA:  Chest pressure. EXAM: CHEST - 2 VIEW COMPARISON:  11/20/2009 FINDINGS: Few densities at the left costophrenic angle. Otherwise, the lungs are clear. Heart and mediastinum are within normal limits. Trachea is midline. Negative for a pneumothorax. No acute bone abnormality. IMPRESSION: 1. No acute cardiopulmonary disease. 2. Few densities at the left costophrenic angle. This could represent mild atelectasis versus scar. Electronically Signed   By: Markus Daft M.D.   On: 10/08/2021 11:22   CT ABDOMEN PELVIS W CONTRAST  Result Date: 10/08/2021 CLINICAL DATA:  Patient with abdominal pain and constipation for 2 weeks. EXAM: CT ABDOMEN AND PELVIS  WITH CONTRAST TECHNIQUE: Multidetector CT imaging of the abdomen and pelvis was performed using the standard protocol following bolus administration of intravenous contrast. RADIATION DOSE REDUCTION: This exam was performed according to the departmental dose-optimization program which includes automated exposure control, adjustment of the mA and/or kV according to patient size and/or use of iterative reconstruction technique. CONTRAST:  167m OMNIPAQUE IOHEXOL 300 MG/ML  SOLN COMPARISON:  Chest CT December 25, 2020 FINDINGS: Lower chest: Normal heart size. Right lung base atelectasis. No pleural effusion. Calcified granuloma left lower lobe. Hepatobiliary: Liver is normal in size and contour. No focal lesion identified. There is gallbladder wall thickening with pericholecystic fluid and fat stranding. Stones within the gallbladder lumen. No intrahepatic or extrahepatic biliary ductal dilatation. There appears to be a large stone within the gallbladder neck. Pancreas: Unremarkable Spleen: Unremarkable Adrenals/Urinary Tract: Normal adrenal glands. Kidneys enhance symmetrically with contrast. No hydronephrosis. Urinary bladder is unremarkable. Stomach/Bowel: No abnormal bowel wall thickening or evidence for bowel obstruction. No free fluid or free intraperitoneal air. Small hiatal hernia. Vascular/Lymphatic: Normal caliber abdominal aorta. No retroperitoneal lymphadenopathy. Reproductive: Uterus and adnexal structures are unremarkable. Other: None. Musculoskeletal: Lower thoracic and lumbar spine degenerative changes. Redemonstrated mild superior endplate changes involving the T11 vertebral body. IMPRESSION: Findings most compatible with acute cholecystitis. Recommend further evaluation with right upper quadrant ultrasound. Electronically Signed   By: DLovey NewcomerM.D.   On: 10/08/2021 12:41   [place]Abdomen Limited RUQ (LIVER/GB)  Result Date: 10/08/2021 CLINICAL DATA:  Patient with suspected cholecystitis on prior CT.  Right upper quadrant pain. EXAM: ULTRASOUND ABDOMEN LIMITED RIGHT UPPER QUADRANT COMPARISON:  CT abdomen pelvis earlier same day. FINDINGS: Gallbladder: Gallbladder wall thickening measuring up to 6 mm. Pericholecystic fluid. Multiple stones in the gallbladder lumen including a 2.6 cm stone at the gallbladder neck. Common bile duct: Obscured secondary to large stone at the gallbladder neck. Measures approximately 6-7 mm. Liver: No focal lesion identified. Within normal limits in parenchymal echogenicity. Portal vein is patent on color Doppler imaging with normal direction of blood flow towards the liver. Other: None. IMPRESSION: Cholelithiasis with findings compatible with acute cholecystitis. Electronically Signed   By: DLovey NewcomerM.D.   On: 10/08/2021 13:49    Procedures Procedures    Medications Ordered in ED Medications  enoxaparin (LOVENOX) injection 40 mg (has no administration in time range)  metoprolol tartrate (LOPRESSOR) injection 5 mg (has no administration in time range)  pantoprazole (PROTONIX) injection 40 mg (has no administration in time range)  simethicone (MYLICON) chewable tablet 40 mg (has no administration in time range)  ondansetron (ZOFRAN-ODT) disintegrating tablet 4 mg (has no administration in time range)    Or  ondansetron (ZOFRAN) injection 4 mg (has no administration in time range)  polyethylene glycol (MIRALAX / GLYCOLAX) packet  17 g (has no administration in time range)  methocarbamol (ROBAXIN) tablet 500 mg (has no administration in time range)  morphine 2 MG/ML injection 2-4 mg (has no administration in time range)  oxyCODONE (Oxy IR/ROXICODONE) immediate release tablet 5-10 mg (has no administration in time range)  acetaminophen (TYLENOL) tablet 650 mg (has no administration in time range)    Or  acetaminophen (TYLENOL) suppository 650 mg (has no administration in time range)  0.9 %  sodium chloride infusion (has no administration in time range)  cefTRIAXone  (ROCEPHIN) 2 g in sodium chloride 0.9 % 100 mL IVPB (2 g Intravenous New Bag/Given 10/08/21 1458)  sodium chloride flush (NS) 0.9 % injection 3 mL (3 mLs Intravenous Given 10/08/21 1258)  alum & mag hydroxide-simeth (MAALOX/MYLANTA) 200-200-20 MG/5ML suspension 30 mL (30 mLs Oral Given 10/08/21 1123)    And  lidocaine (XYLOCAINE) 2 % viscous mouth solution 15 mL (15 mLs Oral Given 10/08/21 1124)  pantoprazole (PROTONIX) injection 40 mg (40 mg Intravenous Given 10/08/21 1123)  sodium chloride 0.9 % bolus 1,000 mL (0 mLs Intravenous Stopped 10/08/21 1454)  iohexol (OMNIPAQUE) 300 MG/ML solution 100 mL (100 mLs Intravenous Contrast Given 10/08/21 1215)    ED Course/ Medical Decision Making/ A&P Clinical Course as of 10/08/21 1516  Mon Oct 08, 2021  [facility] -- will admit  [CP]    Clinical Course User Index [CP] Anselmo Pickler, PA-C                           Medical Decision Making  I discussed this case with my attending physician who cosigned this note including patient's presenting symptoms, physical exam, and planned diagnostics and interventions. Attending physician stated agreement with plan or made changes to plan which were implemented.   This is an overall well-appearing patient presenting with a constellation of symptoms including abdominal pain, right flank pain, chest pain, low back pain for the last 2 weeks.  Patient reports that the pain is worse after meals.  Patient has not tried any acid reflux treatments, reports that her diet has been more mild, soft which helped some with her symptoms.  Given her varied presentation and differential diagnosis is broad, including cholecystitis, choledocholithiasis, symptomatic was lithiasis, nephrolithiasis, less likely to be UTI versus pyelo as patient is denying any urinary symptoms, is afebrile, also considered ACS given radiation to the chest, gastritis, GERD, gastric ulcer.  We will evaluate for possible bleeding ulcer in context  of potentially darkening stool over the last several days.  I personally ordered and reviewed lab work including urinalysis which shows trace hemoglobin, ketones, rare bacteria.  Do not believe this represents an acute UTI.  Hemoccult was negative, urine pregnancy test is negative.  Her lipase is stable, her troponin is negative with symptoms ongoing continuously for more than 2 weeks.  Her CBC is unremarkable, she has no white count.  Her CMP is remarkable for mild hyponatremia to 130, mildly elevated blood glucose of 109.  She has slightly elevated liver enzymes with an AST of 62, ALT of 71, alk phos of 149.  Her T bili is stable.  We will administer fluids to help with hyponatremia.  While waiting for lab work administered GI cocktail, Protonix.  Patient reports some improvement of her abdominal pain, but reports that she does still have ongoing right upper quadrant, right flank pain.  In context of her focal tenderness especially in the right upper quadrant,  right flank, and epigastrium we will obtain CT of abdomen and pelvis.  CT abdomen pelvis reveals findings consistent with acute cholecystitis.  I personally reviewed these images, and agree with radiologist interpretation.  They recommend right upper quadrant ultrasound to further evaluate for cholecystitis.  Right upper quadrant ultrasound reveals 2.6 cm stone in the gallbladder neck, common bile duct dilation to 6 to 7 mm.  Agree with radiologist interpretation of these findings.  I ordered and spoke with consult to general surgery.  I spoke with Margie Billet working for Dr. Hassell Done.  Discussed outpatient follow-up as patient is not in significant pain versus admit for cholecystectomy in the coming 1 to 2 days.  After discussion with patient we agreed shared decision making to admit for cholecystectomy today as patient has had significant ongoing pain, is not pain-free at time my evaluation.  We will begin IV Rocephin.  Dr. Earlie Server team agrees to  admission at this time.  Admission orders placed by their team. Final Clinical Impression(s) / ED Diagnoses Final diagnoses:  RUQ pain    Rx / DC Orders ED Discharge Orders     None         Anselmo Pickler, PA-C 10/08/21 [place], [place], DO 10/08/21 1537

## 2021-10-08 NOTE — ED Notes (Signed)
[facility] leaving with patient

## 2021-10-08 NOTE — Discharge Instructions (Addendum)
[facility] ° °Please arrive at least 30 min before your appointment to complete your check in paperwork.  If you are unable to arrive 30 min prior to your appointment time we may have to cancel or reschedule you. °LAPAROSCOPIC SURGERY: POST OP INSTRUCTIONS °Always review your discharge instruction sheet given to you by the facility where your surgery was performed. °IF YOU HAVE DISABILITY OR FAMILY LEAVE FORMS, YOU MUST BRING THEM TO THE OFFICE FOR PROCESSING.   °DO NOT GIVE THEM TO YOUR DOCTOR. ° °PAIN CONTROL ° °First take acetaminophen (Tylenol) AND/or ibuprofen (Advil) to control your pain after surgery.  Follow directions on package.  Taking acetaminophen (Tylenol) and/or ibuprofen (Advil) regularly after surgery will help to control your pain and lower the amount of prescription pain medication you may need.  You should not take more than 4,000 mg (4 grams) of acetaminophen (Tylenol) in 24 hours.  You should not take ibuprofen (Advil), aleve, motrin, naprosyn or other NSAIDS if you have a history of stomach ulcers or chronic kidney disease.  °A prescription for pain medication may be given to you upon discharge.  Take your pain medication as prescribed, if you still have uncontrolled pain after taking acetaminophen (Tylenol) or ibuprofen (Advil). °Use ice packs to help control pain. °If you need a refill on your pain medication, please contact your pharmacy.  They will contact our office to request authorization. Prescriptions will not be filled after 5pm or on week-ends. ° °HOME MEDICATIONS °Take your usually prescribed medications unless otherwise directed. ° °DIET °You should follow a light diet the first few days after arrival home.  Be sure to include lots of fluids daily. Avoid fatty, fried foods.  ° °CONSTIPATION °It is common to experience some constipation after surgery and if you are taking pain medication.  Increasing fluid intake and taking a stool softener (such as Colace)  will usually help or prevent this problem from occurring.  A mild laxative (Milk of Magnesia or Miralax) should be taken according to package instructions if there are no bowel movements after 48 hours. ° °WOUND/INCISION CARE °Most patients will experience some swelling and bruising in the area of the incisions.  Ice packs will help.  Swelling and bruising can take several days to resolve.  °Unless discharge instructions indicate otherwise, follow guidelines below  °STERI-STRIPS - you may remove your outer bandages 48 hours after surgery, and you may shower at that time.  You have steri-strips (small skin tapes) in place directly over the incision.  These strips should be left on the skin for 7-10 days.   °DERMABOND/SKIN GLUE - you may shower in 24 hours.  The glue will flake off over the next 2-3 weeks. °Any sutures or staples will be removed at the office during your follow-up visit. ° °ACTIVITIES °You may resume regular (light) daily activities beginning the next day--such as daily self-care, walking, climbing stairs--gradually increasing activities as tolerated.  You may have sexual intercourse when it is comfortable.  Refrain from any heavy lifting or straining until approved by your doctor. °You may drive when you are no longer taking prescription pain medication, you can comfortably wear a seatbelt, and you can safely maneuver your car and apply brakes. ° °FOLLOW-UP °You should see your doctor in the office for a follow-up appointment approximately 2-3 weeks after your surgery.  You should have been given your post-op/follow-up appointment when your surgery was scheduled.  If you did not receive a post-op/follow-up appointment, make sure   that you call for this appointment within a day or two after you arrive home to insure a convenient appointment time. ° ° °WHEN TO CALL YOUR DOCTOR: °Fever over 101.0 °Inability to urinate °Continued bleeding from incision. °Increased pain, redness, or drainage from the  incision. °Increasing abdominal pain ° °The clinic staff is available to answer your questions during regular business hours.  Please don’t hesitate to call and ask to speak to one of the nurses for clinical concerns.  If you have a medical emergency, go to the nearest emergency room or call 911.  A surgeon from [facility] is always on call at the hospital. °[address], [address], [place], [place]  [address] ? [address] [address], [place], [place]   [address] °[phone] ? [phone] ? FAX [phone] ° ° ° ° °Managing Your Pain After Surgery Without Opioids ° ° ° °Thank you for participating in our program to help patients manage their pain after surgery without opioids. This is part of our effort to provide you with the best care possible, without exposing you or your family to the risk that opioids pose. ° °What pain can I expect after surgery? °You can expect to have some pain after surgery. This is normal. The pain is typically worse the day after surgery, and quickly begins to get better. °Many studies have found that many patients are able to manage their pain after surgery with Over-the-Counter (OTC) medications such as Tylenol and Motrin. If you have a condition that does not allow you to take Tylenol or Motrin, notify your surgical team. ° °How will I manage my pain? °The best strategy for controlling your pain after surgery is around the clock pain control with Tylenol (acetaminophen) and Motrin (ibuprofen or Advil). Alternating these medications with each other allows you to maximize your pain control. In addition to Tylenol and Motrin, you can use heating pads or ice packs on your incisions to help reduce your pain. ° °How will I alternate your regular strength over-the-counter pain medication? °You will take a dose of pain medication every three hours. °Start by taking 650 mg of Tylenol (2 pills of 325 mg) °3 hours later take 600 mg of Motrin (3 pills of 200 mg) °3 hours after  taking the Motrin take 650 mg of Tylenol °3 hours after that take 600 mg of Motrin. ° ° °- 1 - ° °See example - if your first dose of Tylenol is at 12:00 PM ° ° °12:00 PM Tylenol 650 mg (2 pills of 325 mg)  °3:00 PM Motrin 600 mg (3 pills of 200 mg)  °6:00 PM Tylenol 650 mg (2 pills of 325 mg)  °9:00 PM Motrin 600 mg (3 pills of 200 mg)  °Continue alternating every 3 hours  ° °We recommend that you follow this schedule around-the-clock for at least 3 days after surgery, or until you feel that it is no longer needed. Use the table on the last page of this handout to keep track of the medications you are taking. °Important: °Do not take more than 3000mg of Tylenol or 3200mg of Motrin in a 24-hour period. °Do not take ibuprofen/Motrin if you have a history of bleeding stomach ulcers, severe kidney disease, &/or actively taking a blood thinner ° °What if I still have pain? °If you have pain that is not controlled with the over-the-counter pain medications (Tylenol and Motrin or Advil) you might have what we call “breakthrough” pain. You will receive a prescription   for a small amount of an opioid pain medication such as Oxycodone, Tramadol, or Tylenol with Codeine. Use these opioid pills in the first 24 hours after surgery if you have breakthrough pain. Do not take more than 1 pill every 4-6 hours. ° °If you still have uncontrolled pain after using all opioid pills, don't hesitate to call our staff using the number provided. We will help make sure you are managing your pain in the best way possible, and if necessary, we can provide a prescription for additional pain medication. ° ° °Day 1   ° °Time  °Name of Medication Number of pills taken  °Amount of Acetaminophen  °Pain Level  ° °Comments  °AM PM       °AM PM       °AM PM       °AM PM       °AM PM       °AM PM       °AM PM       °AM PM       °Total Daily amount of Acetaminophen °Do not take more than  3,000 mg per day    ° ° °Day 2   ° °Time  °Name of Medication  Number of pills °taken  °Amount of Acetaminophen  °Pain Level  ° °Comments  °AM PM       °AM PM       °AM PM       °AM PM       °AM PM       °AM PM       °AM PM       °AM PM       °Total Daily amount of Acetaminophen °Do not take more than  3,000 mg per day    ° ° °Day 3   ° °Time  °Name of Medication Number of pills taken  °Amount of Acetaminophen  °Pain Level  ° °Comments  °AM PM       °AM PM       °AM PM       °AM PM       ° ° ° °AM PM       °AM PM       °AM PM       °AM PM       °Total Daily amount of Acetaminophen °Do not take more than  3,000 mg per day    ° ° °Day 4   ° °Time  °Name of Medication Number of pills taken  °Amount of Acetaminophen  °Pain Level  ° °Comments  °AM PM       °AM PM       °AM PM       °AM PM       °AM PM       °AM PM       °AM PM       °AM PM       °Total Daily amount of Acetaminophen °Do not take more than  3,000 mg per day    ° ° °Day 5   ° °Time  °Name of Medication Number °of pills taken  °Amount of Acetaminophen  °Pain Level  ° °Comments  °AM PM       °AM PM       °AM PM       °AM PM       °AM PM       °AM   PM       °AM PM       °AM PM       °Total Daily amount of Acetaminophen °Do not take more than  3,000 mg per day    ° ° ° °Day 6   ° °Time  °Name of Medication Number of pills °taken  °Amount of Acetaminophen  °Pain Level  °Comments  °AM PM       °AM PM       °AM PM       °AM PM       °AM PM       °AM PM       °AM PM       °AM PM       °Total Daily amount of Acetaminophen °Do not take more than  3,000 mg per day    ° ° °Day 7   ° °Time  °Name of Medication Number of pills taken  °Amount of Acetaminophen  °Pain Level  ° °Comments  °AM PM       °AM PM       °AM PM       °AM PM       °AM PM       °AM PM       °AM PM       °AM PM       °Total Daily amount of Acetaminophen °Do not take more than  3,000 mg per day    ° ° ° ° °For additional information about how and where to safely dispose of unused opioid °medications - https://www.morepowerfulnc.org ° °Disclaimer: This document  contains information and/or instructional materials adapted from [facility] for the typical patient with your condition. It does not replace medical advice from your health care provider because your experience may differ from that of the °typical patient. Talk to your health care provider if you have any questions about this °document, your condition or your treatment plan. °Adapted from [facility] ° °

## 2021-10-08 NOTE — H&P (Signed)
Amy Cowan Admission Note  Amy Cowan 08-Jan-1961  [id number].    Requesting MD: Lennice Sites Chief Complaint/Reason for Consult: cholecystitis  HPI:  Amy Cowan is a 61yo female PMH HLD and hyperthyroidism who was transferred from [facility] to [facility] for admission to the surgical service with acute cholecystitis. Patient reports a 2 week history of abdominal pain. Pain is RUQ and radiates into her chest and back. Worse after eating. Denies nausea, vomiting, fever, or chills. States that she has been constipated so she took some Miralax but this did not resolve her pain. Tylenol does help some. Work up included CT scan and u/s which shows gallbladder wall thickening, pericholecystic fluid, multiple stones in the gallbladder lumen including a 2.6 cm stone at the gallbladder neck. WBC 8.4. LFTs slightly elevated with AST 62, ALT 71, Alk phos 149, and Tbili 1.2.  Abdominal surgical history: none Anticoagulants: none Smokes 1 PPD Drinks alcohol occasionally Denies illicit drug use  ROS  All systems reviewed and otherwise negative except for as above  Family History  Problem Relation Age of Onset   Thyroid disease Son    Thyroid disease Mother    Lung cancer Mother    Thyroid disease Father    COPD Father    Diabetes Brother     Past Medical History:  Diagnosis Date   Dyslipidemia    Hyperthyroidism     Past Surgical History:  Procedure Laterality Date   BUNIONECTOMY Right     Social History:  reports that she has been smoking. She has been smoking an average of 1 pack per day. She has never used smokeless tobacco. She reports current alcohol use. She reports that she does not use drugs.  Allergies:  Allergies  Allergen Reactions   Atorvastatin Other (See Comments)    (Not in a hospital admission)   Prior to Admission medications   Medication Sig Start Date End Date Taking? Authorizing Provider  simvastatin (ZOCOR) 20 MG tablet every evening.  07/26/18  Yes [provider]  cholecalciferol (VITAMIN D3) 25 MCG (1000 UNIT) tablet 1 tablet    [provider]  methimazole (TAPAZOLE) 5 MG tablet Take 1 tablet (5 mg total) by mouth 3 (three) times daily. 08/12/16   Renato Shin, MD    Blood pressure 118/61, pulse 86, temperature 98.3 F (36.8 C), resp. rate (!) 23, height '5\' 2"'  (1.575 m), weight 81.2 kg, last menstrual period 07/10/2012, SpO2 99 %. Physical Exam: General: pleasant, WD/WN female who is laying in bed in NAD HEENT: head is normocephalic, atraumatic.  Sclera are noninjected.  Pupils equal and round.  Ears and nose without any masses or lesions.  Mouth is pink and moist. Dentition fair Heart: regular, rate, and rhythm.  Normal s1,s2. No obvious murmurs, gallops, or rubs noted.  Palpable pedal pulses bilaterally  Lungs: CTAB, no wheezes, rhonchi, or rales noted.  Respiratory effort nonlabored Abd: soft, mildly tender in RUQ, ND, +BS, no masses, hernias, or organomegaly MS: no BUE/BLE edema, calves soft and nontender Skin: warm and dry with no masses, lesions, or rashes Psych: A&Ox4 with an appropriate affect Neuro: cranial nerves grossly intact, normal speech, thought process intact  Results for orders placed or performed during the hospital encounter of 10/08/21 (from the past 48 hour(s))  Lipase, blood     Status: None   Collection Time: 10/08/21 11:10 AM  Result Value Ref Range   Lipase 25 11 - 51 U/L    Comment: Performed at [facility], [address] [place], [place] [address]  Comprehensive metabolic panel     Status: Abnormal   Collection Time: 10/08/21 11:10 AM  Result Value Ref Range   Sodium 130 (L) 135 - 145 mmol/L   Potassium 4.1 3.5 - 5.1 mmol/L   Chloride 95 (L) 98 - 111 mmol/L   CO2 23 22 - 32 mmol/L   Glucose, Bld 109 (H) 70 - 99 mg/dL    Comment: Glucose reference range applies only to samples taken after fasting for at least 8 hours.   BUN 12 6 - 20 mg/dL    Creatinine, Ser 0.58 0.44 - 1.00 mg/dL   Calcium 9.2 8.9 - 10.3 mg/dL   Total Protein 7.7 6.5 - 8.1 g/dL   Albumin 3.9 3.5 - 5.0 g/dL   AST 62 (H) 15 - 41 U/L   ALT 71 (H) 0 - 44 U/L   Alkaline Phosphatase 149 (H) 38 - 126 U/L   Total Bilirubin 1.2 0.3 - 1.2 mg/dL   GFR, Estimated >60 >60 mL/min    Comment: (NOTE) Calculated using the CKD-EPI Creatinine Equation (2021)    Anion gap 12 5 - 15    Comment: Performed at [facility], [address] [place], [place] [address]  CBC     Status: None   Collection Time: 10/08/21 11:10 AM  Result Value Ref Range   WBC 8.4 4.0 - 10.5 K/uL   RBC 4.72 3.87 - 5.11 MIL/uL   Hemoglobin 13.9 12.0 - 15.0 g/dL   HCT 40.2 36.0 - 46.0 %   MCV 85.2 80.0 - 100.0 fL   MCH 29.4 26.0 - 34.0 pg   MCHC 34.6 30.0 - 36.0 g/dL   RDW 12.3 11.5 - 15.5 %   Platelets 214 150 - 400 K/uL   nRBC 0.0 0.0 - 0.2 %    Comment: Performed at [facility], [address] [place], [place] [address]  Troponin I (High Sensitivity)     Status: None   Collection Time: 10/08/21 11:10 AM  Result Value Ref Range   Troponin I (High Sensitivity) 4 <18 ng/L    Comment: (NOTE) Elevated high sensitivity troponin I (hsTnI) values and significant  changes across serial measurements may suggest ACS but many other  chronic and acute conditions are known to elevate hsTnI results.  Refer to the "Links" section for chest pain algorithms and additional  guidance. Performed at [facility], [address] [place], [place] [address]   Occult blood card to lab, stool Provider will collect     Status: None   Collection Time: 10/08/21 12:00 PM  Result Value Ref Range   Fecal Occult Bld NEGATIVE NEGATIVE    Comment: Performed at [facility], [address] [place], [place] [address]  Urinalysis, Routine w reflex microscopic Urine, Clean Catch     Status: Abnormal   Collection Time: 10/08/21 12:40 PM  Result Value Ref Range   Color, Urine  YELLOW YELLOW   APPearance CLEAR CLEAR   Specific Gravity, Urine 1.010 1.005 - 1.030   pH 7.0 5.0 - 8.0   Glucose, UA NEGATIVE NEGATIVE mg/dL   Hgb urine dipstick TRACE (A) NEGATIVE   Bilirubin Urine NEGATIVE NEGATIVE   Ketones, ur 15 (A) NEGATIVE mg/dL   Protein, ur NEGATIVE NEGATIVE mg/dL   Nitrite NEGATIVE NEGATIVE   Leukocytes,Ua NEGATIVE NEGATIVE    Comment: Performed at [facility]  [address] [place] [place], [place] [address]  Pregnancy, urine     Status: None   Collection Time: 10/08/21 12:40 PM  Result Value Ref Range   Preg Test, Ur NEGATIVE NEGATIVE    Comment:        THE SENSITIVITY OF THIS METHODOLOGY IS >20 mIU/mL. Performed at [facility], [address] [place], [place] [address]   Urinalysis, Microscopic (reflex)     Status: Abnormal   Collection Time: 10/08/21 12:40 PM  Result Value Ref Range   RBC / HPF 0-5 0 - 5 RBC/hpf   WBC, UA 0-5 0 - 5 WBC/hpf   Bacteria, UA RARE (A) NONE SEEN   Squamous Epithelial / LPF 0-5 0 - 5    Comment: Performed at [facility], [address], [place], [place] [address]   DG Chest 2 View  Result Date: 10/08/2021 CLINICAL DATA:  Chest pressure. EXAM: CHEST - 2 VIEW COMPARISON:  11/20/2009 FINDINGS: Few densities at the left costophrenic angle. Otherwise, the lungs are clear. Heart and mediastinum are within normal limits. Trachea is midline. Negative for a pneumothorax. No acute bone abnormality. IMPRESSION: 1. No acute cardiopulmonary disease. 2. Few densities at the left costophrenic angle. This could represent mild atelectasis versus scar. Electronically Signed   By: Markus Daft M.D.   On: 10/08/2021 11:22   CT ABDOMEN PELVIS W CONTRAST  Result Date: 10/08/2021 CLINICAL DATA:  Patient with abdominal pain and constipation for 2 weeks. EXAM: CT ABDOMEN AND PELVIS WITH CONTRAST TECHNIQUE: Multidetector CT imaging of the abdomen and pelvis was performed using the standard protocol following bolus  administration of intravenous contrast. RADIATION DOSE REDUCTION: This exam was performed according to the departmental dose-optimization program which includes automated exposure control, adjustment of the mA and/or kV according to patient size and/or use of iterative reconstruction technique. CONTRAST:  121m OMNIPAQUE IOHEXOL 300 MG/ML  SOLN COMPARISON:  Chest CT December 25, 2020 FINDINGS: Lower chest: Normal heart size. Right lung base atelectasis. No pleural effusion. Calcified granuloma left lower lobe. Hepatobiliary: Liver is normal in size and contour. No focal lesion identified. There is gallbladder wall thickening with pericholecystic fluid and fat stranding. Stones within the gallbladder lumen. No intrahepatic or extrahepatic biliary ductal dilatation. There appears to be a large stone within the gallbladder neck. Pancreas: Unremarkable Spleen: Unremarkable Adrenals/Urinary Tract: Normal adrenal glands. Kidneys enhance symmetrically with contrast. No hydronephrosis. Urinary bladder is unremarkable. Stomach/Bowel: No abnormal bowel wall thickening or evidence for bowel obstruction. No free fluid or free intraperitoneal air. Small hiatal hernia. Vascular/Lymphatic: Normal caliber abdominal aorta. No retroperitoneal lymphadenopathy. Reproductive: Uterus and adnexal structures are unremarkable. Other: None. Musculoskeletal: Lower thoracic and lumbar spine degenerative changes. Redemonstrated mild superior endplate changes involving the T11 vertebral body. IMPRESSION: Findings most compatible with acute cholecystitis. Recommend further evaluation with right upper quadrant ultrasound. Electronically Signed   By: DLovey NewcomerM.D.   On: 10/08/2021 12:41   UKoreaAbdomen Limited RUQ (LIVER/GB)  Result Date: 10/08/2021 CLINICAL DATA:  Patient with suspected cholecystitis on prior CT. Right upper quadrant pain. EXAM: ULTRASOUND ABDOMEN LIMITED RIGHT UPPER QUADRANT COMPARISON:  CT abdomen pelvis earlier same day.  FINDINGS: Gallbladder: Gallbladder wall thickening measuring up to 6 mm. Pericholecystic fluid. Multiple stones in the gallbladder lumen including a 2.6 cm stone at the gallbladder neck. Common bile duct: Obscured secondary to large stone at the gallbladder neck. Measures approximately 6-7 mm. Liver: No focal lesion identified. Within normal limits  in parenchymal echogenicity. Portal vein is patent on color Doppler imaging with normal direction of blood flow towards the liver. Other: None. IMPRESSION: Cholelithiasis with findings compatible with acute cholecystitis. Electronically Signed   By: Lovey Newcomer M.D.   On: 10/08/2021 13:49      Assessment/Plan Acute cholecystitis  - Patient with clinical and radiographic findings consistent with acute cholecystitis. Recommend admission for laparoscopic cholecystectomy with IOC. Her LFTs are downtrending.  Can attempt IOC during case today as able. Start rocephin.   ID - rocephin VTE - SCDs, lovenox FEN - IVF, NPO Foley - none  Hyponatremia HLD Hyperthyroidism  Moderate Medical Decision Making  Henreitta Cea, [facility] Please see Amion for pager number during day hours 7:00am-4:30pm

## 2021-10-08 NOTE — ED Triage Notes (Signed)
Pt reports abdominal pain and constipation for 2 weeks. Worse with eating. Pt went to PCP on Thursday and dx with inflamed intestines and started on miralax. Last BM was small and this morning. Pt reports pressure chest pressure as well

## 2021-10-09 ENCOUNTER — Observation Stay (HOSPITAL_COMMUNITY): Payer: BC Managed Care – PPO | Admitting: Anesthesiology

## 2021-10-09 ENCOUNTER — Encounter (HOSPITAL_COMMUNITY): Payer: Self-pay

## 2021-10-09 ENCOUNTER — Encounter (HOSPITAL_COMMUNITY)
Admission: EM | Disposition: A | Discharge status: Home / Self Care | Payer: Self-pay | Source: Home / Self Care | Attending: Emergency Medicine

## 2021-10-09 DIAGNOSIS — K811 Chronic cholecystitis: Secondary | ICD-10-CM | POA: Diagnosis not present

## 2021-10-09 DIAGNOSIS — K8000 Calculus of gallbladder with acute cholecystitis without obstruction: Secondary | ICD-10-CM | POA: Diagnosis not present

## 2021-10-09 DIAGNOSIS — K828 Other specified diseases of gallbladder: Secondary | ICD-10-CM | POA: Diagnosis not present

## 2021-10-09 LAB — CBC
HCT: 35.4 % — ABNORMAL LOW (ref 36.0–46.0)
Hemoglobin: 11.8 g/dL — ABNORMAL LOW (ref 12.0–15.0)
MCH: 29.4 pg (ref 26.0–34.0)
MCHC: 33.3 g/dL (ref 30.0–36.0)
MCV: 88.1 fL (ref 80.0–100.0)
Platelets: 182 10*3/uL (ref 150–400)
RBC: 4.02 MIL/uL (ref 3.87–5.11)
RDW: 12.5 % (ref 11.5–15.5)
WBC: 6.5 10*3/uL (ref 4.0–10.5)
nRBC: 0 % (ref 0.0–0.2)

## 2021-10-09 LAB — HIV ANTIBODY (ROUTINE TESTING W REFLEX): HIV Screen 4th Generation wRfx: NONREACTIVE

## 2021-10-09 LAB — COMPREHENSIVE METABOLIC PANEL
ALT: 64 U/L — ABNORMAL HIGH (ref 0–44)
AST: 46 U/L — ABNORMAL HIGH (ref 15–41)
Albumin: 3.2 g/dL — ABNORMAL LOW (ref 3.5–5.0)
Alkaline Phosphatase: 134 U/L — ABNORMAL HIGH (ref 38–126)
Anion gap: 7 (ref 5–15)
BUN: 9 mg/dL (ref 6–20)
CO2: 26 mmol/L (ref 22–32)
Calcium: 8.6 mg/dL — ABNORMAL LOW (ref 8.9–10.3)
Chloride: 102 mmol/L (ref 98–111)
Creatinine, Ser: 0.51 mg/dL (ref 0.44–1.00)
GFR, Estimated: >60 mL/min (ref >60)
Glucose, Bld: 113 mg/dL — ABNORMAL HIGH (ref 70–99)
Potassium: 4.2 mmol/L (ref 3.5–5.1)
Sodium: 135 mmol/L (ref 135–145)
Total Bilirubin: 0.8 mg/dL (ref 0.3–1.2)
Total Protein: 6.1 g/dL — ABNORMAL LOW (ref 6.5–8.1)

## 2021-10-09 SURGERY — CHOLECYSTECTOMY, ROBOT-ASSISTED, LAPAROSCOPIC
Anesthesia: General | Site: Abdomen

## 2021-10-09 MED ORDER — FENTANYL CITRATE (PF) 100 MCG/2ML IJ SOLN
INTRAMUSCULAR | Status: AC
  Filled 2021-10-09: qty 2

## 2021-10-09 MED ORDER — 0.9 % SODIUM CHLORIDE (POUR BTL) OPTIME
TOPICAL | Status: DC | PRN
  Administered 2021-10-09: 1000 mL

## 2021-10-09 MED ORDER — BUPIVACAINE LIPOSOME 1.3 % IJ SUSP
INTRAMUSCULAR | Status: AC
  Filled 2021-10-09: qty 20

## 2021-10-09 MED ORDER — CEFAZOLIN SODIUM-DEXTROSE 2-4 GM/100ML-% IV SOLN
INTRAVENOUS | Status: AC
  Filled 2021-10-09: qty 100

## 2021-10-09 MED ORDER — MIDAZOLAM HCL 2 MG/2ML IJ SOLN
INTRAMUSCULAR | Status: AC
  Filled 2021-10-09: qty 2

## 2021-10-09 MED ORDER — MIDAZOLAM HCL 5 MG/5ML IJ SOLN
INTRAMUSCULAR | Status: DC | PRN
Start: 2021-10-09 — End: 2021-10-09
  Administered 2021-10-09: 2 mg via INTRAVENOUS

## 2021-10-09 MED ORDER — ONDANSETRON HCL 4 MG/2ML IJ SOLN: 4.0000 mg | Freq: Once | INTRAMUSCULAR | Status: DC | PRN

## 2021-10-09 MED ORDER — LACTATED RINGERS IV SOLN: INTRAVENOUS | Status: DC

## 2021-10-09 MED ORDER — SODIUM CHLORIDE 0.9 % IV SOLN: INTRAVENOUS | Status: DC

## 2021-10-09 MED ORDER — FENTANYL CITRATE (PF) 250 MCG/5ML IJ SOLN
INTRAMUSCULAR | Status: AC
  Filled 2021-10-09: qty 5

## 2021-10-09 MED ORDER — LACTATED RINGERS IR SOLN
Status: DC | PRN
  Administered 2021-10-09: 1000 mL

## 2021-10-09 MED ORDER — KETOROLAC TROMETHAMINE 30 MG/ML IJ SOLN
INTRAMUSCULAR | Status: AC
  Filled 2021-10-09: qty 1

## 2021-10-09 MED ORDER — INDOCYANINE GREEN 25 MG IV SOLR
INTRAVENOUS | Status: DC | PRN
  Administered 2021-10-09: 2.5 mg via INTRAVENOUS

## 2021-10-09 MED ORDER — PROPOFOL 10 MG/ML IV BOLUS
INTRAVENOUS | Status: AC
  Filled 2021-10-09: qty 20

## 2021-10-09 MED ORDER — ONDANSETRON HCL 4 MG/2ML IJ SOLN
INTRAMUSCULAR | Status: DC | PRN
  Administered 2021-10-09: 4 mg via INTRAVENOUS

## 2021-10-09 MED ORDER — SUGAMMADEX SODIUM 200 MG/2ML IV SOLN
INTRAVENOUS | Status: DC | PRN
  Administered 2021-10-09: 200 mg via INTRAVENOUS

## 2021-10-09 MED ORDER — FENTANYL CITRATE (PF) 100 MCG/2ML IJ SOLN
INTRAMUSCULAR | Status: DC | PRN
  Administered 2021-10-09 (×3): 50 ug via INTRAVENOUS
  Administered 2021-10-09: 100 ug via INTRAVENOUS
  Administered 2021-10-09 (×3): 50 ug via INTRAVENOUS

## 2021-10-09 MED ORDER — HYDROMORPHONE HCL 1 MG/ML IJ SOLN: 0.2500 mg | INTRAMUSCULAR | Status: DC | PRN

## 2021-10-09 MED ORDER — DEXAMETHASONE SODIUM PHOSPHATE 10 MG/ML IJ SOLN
INTRAMUSCULAR | Status: DC | PRN
  Administered 2021-10-09: 5 mg via INTRAVENOUS

## 2021-10-09 MED ORDER — OXYCODONE HCL 5 MG/5ML PO SOLN: 5.0000 mg | Freq: Once | ORAL | Status: DC | PRN

## 2021-10-09 MED ORDER — ROCURONIUM BROMIDE 10 MG/ML (PF) SYRINGE
PREFILLED_SYRINGE | INTRAVENOUS | Status: DC | PRN
  Administered 2021-10-09: 70 mg via INTRAVENOUS
  Administered 2021-10-09: 30 mg via INTRAVENOUS
  Administered 2021-10-09: 10 mg via INTRAVENOUS

## 2021-10-09 MED ORDER — OXYCODONE HCL 5 MG PO TABS: 5.0000 mg | ORAL_TABLET | Freq: Once | ORAL | Status: DC | PRN

## 2021-10-09 MED ORDER — ONDANSETRON HCL 4 MG/2ML IJ SOLN
INTRAMUSCULAR | Status: AC
  Filled 2021-10-09: qty 2

## 2021-10-09 MED ORDER — LIDOCAINE 2% (20 MG/ML) 5 ML SYRINGE
INTRAMUSCULAR | Status: DC | PRN
Start: 2021-10-09 — End: 2021-10-09
  Administered 2021-10-09: 80 mg via INTRAVENOUS

## 2021-10-09 MED ORDER — KETOROLAC TROMETHAMINE 30 MG/ML IJ SOLN
30.0000 mg | Freq: Once | INTRAMUSCULAR | Status: AC | PRN
  Administered 2021-10-09: 30 mg via INTRAVENOUS

## 2021-10-09 MED ORDER — PROPOFOL 10 MG/ML IV BOLUS
INTRAVENOUS | Status: DC | PRN
  Administered 2021-10-09: 160 mg via INTRAVENOUS

## 2021-10-09 MED ORDER — PHENYLEPHRINE 40 MCG/ML (10ML) SYRINGE FOR IV PUSH (FOR BLOOD PRESSURE SUPPORT)
PREFILLED_SYRINGE | INTRAVENOUS | Status: DC | PRN
  Administered 2021-10-09: 80 ug via INTRAVENOUS
  Administered 2021-10-09 (×2): 120 ug via INTRAVENOUS

## 2021-10-09 MED ORDER — ENOXAPARIN SODIUM 40 MG/0.4ML IJ SOSY
40.0000 mg | PREFILLED_SYRINGE | INTRAMUSCULAR | Status: DC
  Administered 2021-10-10: 40 mg via SUBCUTANEOUS
  Filled 2021-10-09: qty 0.4

## 2021-10-09 MED ORDER — CEFTRIAXONE SODIUM 2 G IJ SOLR
INTRAMUSCULAR | Status: DC | PRN
  Administered 2021-10-09: 2 g via INTRAVENOUS

## 2021-10-09 MED ORDER — INDOCYANINE GREEN 25 MG IV SOLR
2.5000 mg | Freq: Once | INTRAVENOUS | Status: DC
  Filled 2021-10-09: qty 10

## 2021-10-09 SURGICAL SUPPLY — 54 items
ADH SKN CLS APL DERMABOND .7 (GAUZE/BANDAGES/DRESSINGS) ×2
APPLIER CLIP 5 13 M/L LIGAMAX5 (MISCELLANEOUS)
APR CLP MED LRG 5 ANG JAW (MISCELLANEOUS)
BLADE SURG 15 STRL LF DISP TIS (BLADE) ×3 IMPLANT
BLADE SURG 15 STRL SS (BLADE) ×3
CLIP APPLIE 5 13 M/L LIGAMAX5 (MISCELLANEOUS) IMPLANT
CLIP LIGATING HEM O LOK PURPLE (MISCELLANEOUS) IMPLANT
CLIP LIGATING HEMO O LOK GREEN (MISCELLANEOUS) IMPLANT
COVER SURGICAL LIGHT HANDLE (MISCELLANEOUS) ×4 IMPLANT
COVER TIP SHEARS 8 DVNC (MISCELLANEOUS) ×3 IMPLANT
COVER TIP SHEARS 8MM DA VINCI (MISCELLANEOUS) ×3
DECANTER SPIKE VIAL GLASS SM (MISCELLANEOUS) ×4 IMPLANT
DERMABOND ADVANCED (GAUZE/BANDAGES/DRESSINGS) ×1
DERMABOND ADVANCED .7 DNX12 (GAUZE/BANDAGES/DRESSINGS) ×3 IMPLANT
DRAIN CHANNEL 19F RND (DRAIN) ×2 IMPLANT
DRAPE ARM DVNC X/XI (DISPOSABLE) ×12 IMPLANT
DRAPE COLUMN DVNC XI (DISPOSABLE) ×3 IMPLANT
DRAPE DA VINCI XI ARM (DISPOSABLE) ×12
DRAPE DA VINCI XI COLUMN (DISPOSABLE) ×3
ELECT REM PT RETURN 15FT ADLT (MISCELLANEOUS) ×4 IMPLANT
EVACUATOR SILICONE 100CC (DRAIN) ×2 IMPLANT
GLOVE SURG ENC TEXT LTX SZ8 (GLOVE) ×8 IMPLANT
GOWN STRL REUS W/TWL XL LVL3 (GOWN DISPOSABLE) ×16 IMPLANT
GRASPER SUT TROCAR 14GX15 (MISCELLANEOUS) IMPLANT
IRRIG SUCT STRYKERFLOW 2 WTIP (MISCELLANEOUS)
IRRIGATION SUCT STRKRFLW 2 WTP (MISCELLANEOUS) IMPLANT
KIT BASIN OR (CUSTOM PROCEDURE TRAY) ×4 IMPLANT
KIT TURNOVER KIT A (KITS) IMPLANT
MANIFOLD NEPTUNE II (INSTRUMENTS) ×4 IMPLANT
MARKER SKIN DUAL TIP RULER LAB (MISCELLANEOUS) ×4 IMPLANT
NEEDLE HYPO 22GX1.5 SAFETY (NEEDLE) ×4 IMPLANT
OBTURATOR OPTICAL STANDARD 8MM (TROCAR) ×3
OBTURATOR OPTICAL STND 8 DVNC (TROCAR) ×2
OBTURATOR OPTICALSTD 8 DVNC (TROCAR) ×3 IMPLANT
PACK CARDIOVASCULAR III (CUSTOM PROCEDURE TRAY) ×4 IMPLANT
PAD POSITIONING PINK XL (MISCELLANEOUS) ×4 IMPLANT
SEAL CANN UNIV 5-8 DVNC XI (MISCELLANEOUS) ×12 IMPLANT
SEAL XI 5MM-8MM UNIVERSAL (MISCELLANEOUS) ×12
SEALER VESSEL DA VINCI XI (MISCELLANEOUS) ×3
SEALER VESSEL EXT DVNC XI (MISCELLANEOUS) ×3 IMPLANT
SOL ANTI FOG 6CC (MISCELLANEOUS) ×3 IMPLANT
SOLUTION ANTI FOG 6CC (MISCELLANEOUS) ×1
SOLUTION ELECTROLUBE (MISCELLANEOUS) ×4 IMPLANT
SPONGE T-LAP 18X18 ~~LOC~~+RFID (SPONGE) ×4 IMPLANT
SUT ETHILON 3 0 PS 1 (SUTURE) ×2 IMPLANT
SUT MNCRL AB 4-0 PS2 18 (SUTURE) ×8 IMPLANT
SUT VICRYL 0 TIES 12 18 (SUTURE) IMPLANT
SYR 20ML LL LF (SYRINGE) ×4 IMPLANT
SYS RETRIEVAL 5MM INZII UNIV (BASKET)
SYSTEM RETRIEVL 5MM INZII UNIV (BASKET) IMPLANT
TOWEL OR 17X26 10 PK STRL BLUE (TOWEL DISPOSABLE) ×4 IMPLANT
TOWEL OR NON WOVEN STRL DISP B (DISPOSABLE) IMPLANT
TROCAR BLADELESS OPT 5 100 (ENDOMECHANICALS) ×4 IMPLANT
TUBING INSUFFLATION 10FT LAP (TUBING) ×4 IMPLANT

## 2021-10-09 NOTE — Interval H&P Note (Signed)
History and Physical Interval Note:  10/09/2021 10:38 AM  Amy Cowan  has presented today for surgery, with the diagnosis of ACUTE CHOLECYSTITIS.  The various methods of treatment have been discussed with the patient and family. After consideration of risks, benefits and other options for treatment, the patient has consented to  Procedure(s): XI ROBOTIC ASSISTED LAPAROSCOPIC CHOLECYSTECTOMY (N/A) INTRAOPERATIVE CHOLANGIOGRAM (N/A) as a surgical intervention.  The patient's history has been reviewed, patient examined, no change in status, stable for surgery.  I have reviewed the patient's chart and labs.  Questions were answered to the patient's satisfaction.     Pedro Earls

## 2021-10-09 NOTE — Anesthesia Preprocedure Evaluation (Addendum)
Anesthesia Evaluation  Patient identified by MRN, date of birth, ID band Patient awake    Reviewed: Allergy & Precautions, NPO status , Patient's Chart, lab work & pertinent test results  Airway Mallampati: III  TM Distance: <3 FB Neck ROM: Full    Dental no notable dental hx.    Pulmonary neg pulmonary ROS, Current Smoker and Patient abstained from smoking.,    Pulmonary exam normal breath sounds clear to auscultation       Cardiovascular negative cardio ROS Normal cardiovascular exam Rhythm:Regular Rate:Normal     Neuro/Psych negative neurological ROS  negative psych ROS   GI/Hepatic negative GI ROS, Neg liver ROS,   Endo/Other  Hyperthyroidism   Renal/GU negative Renal ROS  negative genitourinary   Musculoskeletal negative musculoskeletal ROS (+)   Abdominal   Peds negative pediatric ROS (+)  Hematology  (+) anemia ,   Anesthesia Other Findings   Reproductive/Obstetrics negative OB ROS                            Anesthesia Physical Anesthesia Plan  ASA: 2  Anesthesia Plan: General   Post-op Pain Management:    Induction: Intravenous  PONV Risk Score and Plan: 2 and Ondansetron, Dexamethasone and Treatment may vary due to age or medical condition  Airway Management Planned: Oral ETT  Additional Equipment:   Intra-op Plan:   Post-operative Plan: Extubation in OR  Informed Consent: I have reviewed the patients History and Physical, chart, labs and discussed the procedure including the risks, benefits and alternatives for the proposed anesthesia with the patient or authorized representative who has indicated his/her understanding and acceptance.     Dental advisory given  Plan Discussed with: CRNA and Surgeon  Anesthesia Plan Comments:         Anesthesia Quick Evaluation

## 2021-10-09 NOTE — Anesthesia Procedure Notes (Signed)
Procedure Name: Intubation Date/Time: 10/09/2021 11:25 AM Performed by: Gean Maidens, CRNA Pre-anesthesia Checklist: Patient identified, Emergency Drugs available, Suction available, Patient being monitored and Timeout performed Patient Re-evaluated:Patient Re-evaluated prior to induction Oxygen Delivery Method: Circle system utilized Preoxygenation: Pre-oxygenation with 100% oxygen Induction Type: IV induction Ventilation: Mask ventilation without difficulty Laryngoscope Size: Mac and 3 Grade View: Grade I Tube type: Oral Tube size: 7.0 mm Number of attempts: 1 Airway Equipment and Method: Stylet Placement Confirmation: ETT inserted through vocal cords under direct vision, positive ETCO2 and breath sounds checked- equal and bilateral Secured at: 21 cm Tube secured with: Tape Dental Injury: Teeth and Oropharynx as per pre-operative assessment

## 2021-10-09 NOTE — Anesthesia Postprocedure Evaluation (Signed)
Anesthesia Post Note  Patient: Amy Cowan  Procedure(s) Performed: XI ROBOTIC ASSISTED LAPAROSCOPIC CHOLECYSTECTOMY (Abdomen)     Patient location during evaluation: PACU Anesthesia Type: General Level of consciousness: awake and alert Pain management: pain level controlled Vital Signs Assessment: post-procedure vital signs reviewed and stable Respiratory status: spontaneous breathing, nonlabored ventilation, respiratory function stable and patient connected to nasal cannula oxygen Cardiovascular status: blood pressure returned to baseline and stable Postop Assessment: no apparent nausea or vomiting Anesthetic complications: no   No notable events documented.  Last Vitals:  Vitals:   10/09/21 1445 10/09/21 1500  BP: 132/80 135/82  Pulse: 80 77  Resp: 16 13  Temp:    SpO2: 94% 94%    Last Pain:  Vitals:   10/09/21 1500  TempSrc:   PainSc: 4                  Margerite Impastato S

## 2021-10-09 NOTE — Transfer of Care (Signed)
Immediate Anesthesia Transfer of Care Note  Patient: Amy Cowan  Procedure(s) Performed: XI ROBOTIC ASSISTED LAPAROSCOPIC CHOLECYSTECTOMY (Abdomen)  Patient Location: PACU  Anesthesia Type:General  Level of Consciousness: awake, alert  and oriented  Airway & Oxygen Therapy: Patient Spontanous Breathing and Patient connected to face mask oxygen  Post-op Assessment: Report given to RN and Post -op Vital signs reviewed and stable  Post vital signs: Reviewed and stable  Last Vitals:  Vitals Value Taken Time  BP    Temp    Pulse 91 10/09/21 1433  Resp 15 10/09/21 1433  SpO2 91 % 10/09/21 1433  Vitals shown include unvalidated device data.  Last Pain:  Vitals:   10/09/21 1028  TempSrc:   PainSc: 3       Patients Stated Pain Goal: 2 (30/17/20 9106)  Complications: No notable events documented.

## 2021-10-09 NOTE — Progress Notes (Signed)
Transition of Care [facility]) Screening Note  Patient Details  Name: Amy Cowan Date of Birth: 29-Nov-1960  Transition of Care [facility]) CM/SW Contact:    Sherie Don, LCSW Phone Number: 10/09/2021, 10:40 AM  Transition of Care Department [facility]) has reviewed patient and no TOC needs have been identified at this time. We will continue to monitor patient advancement through interdisciplinary progression rounds. If new patient transition needs arise, please place a TOC consult.

## 2021-10-09 NOTE — Op Note (Signed)
Amy Cowan  Primary Care Physician:  Lujean Amel, MD    10/09/2021  2:45 PM  Procedure: Robotic cholecystectomy with Indocyanine green  Surgeon: Althea Grimmer. Hassell Done, MD, FACS Asst:  Romana Juniper, MD, FACS  Anes:  General  Drains:  None  Findings: Severe chronic cholecystitis with fibrosis;  2    Description of Procedure: The patient was taken to OR 2 and given general anesthesia.  The patient was prepped with chlorohexidine prep and draped sterilely. A time out was performed including identifying the patient and discussing their procedure.  Access to the abdomen was achieved with 5 mm Optiview through the left upper quadrant.  Port placement included the four 8 mm robotic trocars and an assistant's port to the left of midline.    The gallbladder was visualized and appeared walled off and was not visualized at first.  Omental packing surrounded the gallbladder.  This was peeled away revealing a very large gallbladder is seen above in the picture.  This was decompressed.  Dr. Windle Guard worked the patient from the OR table and was invaluable and allowing me to perform the surgery.  The infundibulum  was tenaciously stuck to the duodenum and surrounding structures and these were teased away.  At all times we stayed on the gallbladder.  There was a very large and hard gallstone impacted in the infundibulum.  There was some decompression of bile through that.  The tip up was used to retract the very redundant fundus and the fenestrated bipolar was used to attempt to grasp the infundibulum but stone made that more difficult.  In all we worked for about 3 hours on this gallbladder and did a top-down fundus down dissection as well.  At 1 point I got into the gallbladder near the where it was stuck.  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  The gallbladder was eventually dissected free and I think when I looked at the final of the gallbladder we basically  took it out leaving the final and we could see with the ICG 9 firefly the bile coming out from that structure.  It had the mucosal appearance of the distal portion of the gallbladder.  There was no way to get a critical view of surgery with the marked distortion of the anatomy.  I elected to repair this using the firefly with a figure-of-eight suture of 3-0 Vicryl in a simple suture of 0 Vicryl.  We subsequent left a 19 Blake drain in anticipation of some bile leakage..   The fundus of the gallbaldder was grasped and the gallbladder was elevated.  Inflammatory changes were severe and fibrotic.  .    The gallbladder was removed from the gallbladder bed.  The gallbladder was then placed in a bag and brought out through one of the trocar sites. The trocar site had to be enlarged for this very large gallbladder and stone to be removed and the subsequent fascial defect was closed with PMI and 0 vicryl.    Laparoscopic visualization was used when closing fascial defects for trocar sites.   Incisions were injected with Exparel at the beginning of the case and closed with 4-0 Monocryl and Dermabond on the skin.  Sponge and needle count were correct.    The patient was taken to the recovery room in satisfactory condition.

## 2021-10-10 LAB — COMPREHENSIVE METABOLIC PANEL
ALT: 75 U/L — ABNORMAL HIGH (ref 0–44)
AST: 55 U/L — ABNORMAL HIGH (ref 15–41)
Albumin: 3.5 g/dL (ref 3.5–5.0)
Alkaline Phosphatase: 127 U/L — ABNORMAL HIGH (ref 38–126)
Anion gap: 9 (ref 5–15)
BUN: 8 mg/dL (ref 6–20)
CO2: 24 mmol/L (ref 22–32)
Calcium: 8.7 mg/dL — ABNORMAL LOW (ref 8.9–10.3)
Chloride: 100 mmol/L (ref 98–111)
Creatinine, Ser: 0.64 mg/dL (ref 0.44–1.00)
GFR, Estimated: >60 mL/min (ref >60)
Glucose, Bld: 117 mg/dL — ABNORMAL HIGH (ref 70–99)
Potassium: 4.5 mmol/L (ref 3.5–5.1)
Sodium: 133 mmol/L — ABNORMAL LOW (ref 135–145)
Total Bilirubin: 0.5 mg/dL (ref 0.3–1.2)
Total Protein: 6.7 g/dL (ref 6.5–8.1)

## 2021-10-10 LAB — CBC
HCT: 31.9 % — ABNORMAL LOW (ref 36.0–46.0)
Hemoglobin: 10.6 g/dL — ABNORMAL LOW (ref 12.0–15.0)
MCH: 29.9 pg (ref 26.0–34.0)
MCHC: 33.2 g/dL (ref 30.0–36.0)
MCV: 89.9 fL (ref 80.0–100.0)
Platelets: 198 10*3/uL (ref 150–400)
RBC: 3.55 MIL/uL — ABNORMAL LOW (ref 3.87–5.11)
RDW: 12.4 % (ref 11.5–15.5)
WBC: 9.1 10*3/uL (ref 4.0–10.5)
nRBC: 0 % (ref 0.0–0.2)

## 2021-10-10 MED ORDER — POLYETHYLENE GLYCOL 3350 17 G PO PACK: 17.0000 g | PACK | Freq: Every day | ORAL | 0 refills | Status: AC | PRN

## 2021-10-10 MED ORDER — ACETAMINOPHEN 325 MG PO TABS
650.0000 mg | ORAL_TABLET | Freq: Four times a day (QID) | ORAL | Status: AC | PRN
Start: 2021-10-10 — End: ?

## 2021-10-10 MED ORDER — OXYCODONE HCL 5 MG PO TABS: 5.0000 mg | ORAL_TABLET | Freq: Four times a day (QID) | ORAL | 0 refills | Status: DC | PRN

## 2021-10-10 NOTE — Progress Notes (Signed)
Reviewed written d/c instructions w pt and her husband and all questions answered. Also demonstrated emptying JP drain, recording, recharging it and dsg change each day and they again verb understanding. D/C per w/c w all belongings in stable condition.

## 2021-10-10 NOTE — Discharge Summary (Signed)
Patient ID: Amy Cowan Cowan [id number] y.o.  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 Patient reports a 2 week history of abdominal pain. Pain is RUQ and radiates into her chest and back. Worse after eating. Denies nausea, vomiting, fever, or chills. States that she has been constipated so she took some Miralax but this did not resolve her pain. Tylenol does help some. Work up included CT scan and u/s which shows gallbladder wall thickening, pericholecystic fluid, multiple stones in the gallbladder lumen including a 2.6 cm stone at the gallbladder neck. WBC 8.4. LFTs slightly elevated with AST 62, ALT 71, Alk phos 149, and Tbili 1.2.  Procedures Robotic cholecystectomy, Dr. Hassell Done 1/17  Hospital Course:  The patient was admitted and underwent a robotic cholecystectomy with ICG dye and placement of a JP drain.  The patient tolerated the procedure well.  On POD 1, the patient was tolerating a regular diet, voiding well, mobilizing, and pain was controlled with oral pain medications. Her drain was serosang and no evidence of bile. The patient was stable for DC home at this time with appropriate follow up made.   Physical Exam: Abd: soft, appropriately tender, JP with SS output, +BS, incisions c/d/i  Allergies as of 10/10/2021       Reactions   Atorvastatin Other (See Comments)        Medication List     STOP taking these medications    methimazole 5 MG  tablet Commonly known as: TAPAZOLE       TAKE these medications    acetaminophen 325 MG tablet Commonly known as: TYLENOL Take 2 tablets (650 mg total) by mouth every 6 (six) hours as needed for mild pain (or temp > 100).   cholecalciferol 25 MCG (1000 UNIT) tablet Commonly known as: VITAMIN D3 Take 1,000 Units by mouth daily.   oxyCODONE 5 MG immediate release tablet Commonly known as: Oxy IR/ROXICODONE Take 1 tablet (5 mg total) by mouth every 6 (six) hours as needed for severe pain.   polyethylene glycol 17 g packet Commonly known as: MIRALAX / GLYCOLAX Take 17 g by mouth daily as needed for mild constipation.   simvastatin 20 MG tablet Commonly known as: ZOCOR Take 20 mg by mouth every evening.   vitamin C 500 MG tablet Commonly known as: ASCORBIC ACID Take 500 mg by mouth daily.   ZINC PO Take 1 tablet by mouth daily.          Follow-up Information     Johnathan Hausen, MD. Go on 10/19/2021.   Specialty: General Surgery Why: Your appointment is 1/27 at 2:30pm Please arrive 30 minutes prior to your appointment to check in and fill out paperwork. Bring photo ID and insurance information. Contact information: [address] [place] [address] [phone]                 Signed: Saverio Danker, [facility] 10/10/2021, 10:29 AM Please see Amion for pager number during day hours 7:00am-4:30pm,  7-11:30am on Weekends

## 2021-10-11 LAB — SURGICAL PATHOLOGY

## 2021-10-13 ENCOUNTER — Emergency Department (HOSPITAL_BASED_OUTPATIENT_CLINIC_OR_DEPARTMENT_OTHER): Payer: BC Managed Care – PPO

## 2021-10-13 ENCOUNTER — Emergency Department (HOSPITAL_BASED_OUTPATIENT_CLINIC_OR_DEPARTMENT_OTHER)
Admission: EM | Admit: 2021-10-13 | Discharge: 2021-10-13 | Disposition: A | Discharge status: Home / Self Care | Payer: BC Managed Care – PPO | Attending: Emergency Medicine | Admitting: Emergency Medicine

## 2021-10-13 ENCOUNTER — Other Ambulatory Visit: Payer: Self-pay

## 2021-10-13 ENCOUNTER — Encounter (HOSPITAL_BASED_OUTPATIENT_CLINIC_OR_DEPARTMENT_OTHER): Payer: Self-pay

## 2021-10-13 DIAGNOSIS — G8918 Other acute postprocedural pain: Secondary | ICD-10-CM | POA: Diagnosis not present

## 2021-10-13 DIAGNOSIS — R1011 Right upper quadrant pain: Secondary | ICD-10-CM | POA: Diagnosis not present

## 2021-10-13 DIAGNOSIS — R1013 Epigastric pain: Secondary | ICD-10-CM | POA: Diagnosis not present

## 2021-10-13 DIAGNOSIS — R0789 Other chest pain: Secondary | ICD-10-CM | POA: Diagnosis not present

## 2021-10-13 DIAGNOSIS — R1012 Left upper quadrant pain: Secondary | ICD-10-CM | POA: Diagnosis not present

## 2021-10-13 DIAGNOSIS — J9811 Atelectasis: Secondary | ICD-10-CM | POA: Diagnosis not present

## 2021-10-13 DIAGNOSIS — Z20822 Contact with and (suspected) exposure to covid-19: Secondary | ICD-10-CM | POA: Insufficient documentation

## 2021-10-13 DIAGNOSIS — R079 Chest pain, unspecified: Secondary | ICD-10-CM | POA: Diagnosis not present

## 2021-10-13 DIAGNOSIS — R109 Unspecified abdominal pain: Secondary | ICD-10-CM | POA: Diagnosis not present

## 2021-10-13 DIAGNOSIS — N1330 Unspecified hydronephrosis: Secondary | ICD-10-CM | POA: Diagnosis not present

## 2021-10-13 DIAGNOSIS — R1010 Upper abdominal pain, unspecified: Secondary | ICD-10-CM | POA: Diagnosis not present

## 2021-10-13 DIAGNOSIS — K449 Diaphragmatic hernia without obstruction or gangrene: Secondary | ICD-10-CM | POA: Diagnosis not present

## 2021-10-13 LAB — URINALYSIS, ROUTINE W REFLEX MICROSCOPIC
Bilirubin Urine: NEGATIVE
Glucose, UA: NEGATIVE mg/dL
Hgb urine dipstick: NEGATIVE
Ketones, ur: NEGATIVE mg/dL
Leukocytes,Ua: NEGATIVE
Nitrite: NEGATIVE
Protein, ur: NEGATIVE mg/dL
Specific Gravity, Urine: 1.02 (ref 1.005–1.030)
pH: 7.5 (ref 5.0–8.0)

## 2021-10-13 LAB — CBC
HCT: 33.8 % — ABNORMAL LOW (ref 36.0–46.0)
Hemoglobin: 11.4 g/dL — ABNORMAL LOW (ref 12.0–15.0)
MCH: 29.5 pg (ref 26.0–34.0)
MCHC: 33.7 g/dL (ref 30.0–36.0)
MCV: 87.3 fL (ref 80.0–100.0)
Platelets: 336 10*3/uL (ref 150–400)
RBC: 3.87 MIL/uL (ref 3.87–5.11)
RDW: 12.6 % (ref 11.5–15.5)
WBC: 4.9 10*3/uL (ref 4.0–10.5)
nRBC: 0 % (ref 0.0–0.2)

## 2021-10-13 LAB — COMPREHENSIVE METABOLIC PANEL
ALT: 93 U/L — ABNORMAL HIGH (ref 0–44)
AST: 85 U/L — ABNORMAL HIGH (ref 15–41)
Albumin: 3.4 g/dL — ABNORMAL LOW (ref 3.5–5.0)
Alkaline Phosphatase: 157 U/L — ABNORMAL HIGH (ref 38–126)
Anion gap: 11 (ref 5–15)
BUN: 8 mg/dL (ref 6–20)
CO2: 25 mmol/L (ref 22–32)
Calcium: 9.1 mg/dL (ref 8.9–10.3)
Chloride: 100 mmol/L (ref 98–111)
Creatinine, Ser: 0.57 mg/dL (ref 0.44–1.00)
GFR, Estimated: >60 mL/min (ref >60)
Glucose, Bld: 121 mg/dL — ABNORMAL HIGH (ref 70–99)
Potassium: 4 mmol/L (ref 3.5–5.1)
Sodium: 136 mmol/L (ref 135–145)
Total Bilirubin: 0.4 mg/dL (ref 0.3–1.2)
Total Protein: 6.7 g/dL (ref 6.5–8.1)

## 2021-10-13 LAB — TROPONIN I (HIGH SENSITIVITY)
Troponin I (High Sensitivity): 4 ng/L (ref <18)
Troponin I (High Sensitivity): 5 ng/L (ref <18)

## 2021-10-13 LAB — RESP PANEL BY RT-PCR (FLU A&B, COVID) ARPGX2
Influenza A by PCR: NEGATIVE
Influenza B by PCR: NEGATIVE
SARS Coronavirus 2 by RT PCR: NEGATIVE

## 2021-10-13 LAB — LIPASE, BLOOD: Lipase: 28 U/L (ref 11–51)

## 2021-10-13 MED ORDER — MORPHINE SULFATE (PF) 4 MG/ML IV SOLN
4.0000 mg | Freq: Once | INTRAVENOUS | Status: AC
  Administered 2021-10-13: 4 mg via INTRAVENOUS
  Filled 2021-10-13 (×2): qty 1

## 2021-10-13 MED ORDER — ONDANSETRON HCL 4 MG/2ML IJ SOLN
4.0000 mg | Freq: Once | INTRAMUSCULAR | Status: AC
  Administered 2021-10-13: 4 mg via INTRAVENOUS
  Filled 2021-10-13 (×2): qty 2

## 2021-10-13 MED ORDER — FENTANYL CITRATE PF 50 MCG/ML IJ SOSY
50.0000 ug | PREFILLED_SYRINGE | Freq: Once | INTRAMUSCULAR | Status: AC
  Administered 2021-10-13: 50 ug via INTRAVENOUS
  Filled 2021-10-13: qty 1

## 2021-10-13 MED ORDER — IOHEXOL 300 MG/ML  SOLN
100.0000 mL | Freq: Once | INTRAMUSCULAR | Status: AC | PRN
  Administered 2021-10-13: 100 mL via INTRAVENOUS

## 2021-10-13 MED ORDER — ONDANSETRON 4 MG PO TBDP
4.0000 mg | ORAL_TABLET | Freq: Three times a day (TID) | ORAL | 0 refills | Status: AC | PRN
Start: 2021-10-13 — End: ?

## 2021-10-13 MED ORDER — OXYCODONE-ACETAMINOPHEN 5-325 MG PO TABS: 1.0000 | ORAL_TABLET | Freq: Four times a day (QID) | ORAL | 0 refills | Status: DC | PRN

## 2021-10-13 MED ORDER — DOCUSATE SODIUM 100 MG PO CAPS: 100.0000 mg | ORAL_CAPSULE | Freq: Two times a day (BID) | ORAL | 0 refills | Status: AC

## 2021-10-13 NOTE — ED Provider Notes (Signed)
[name] Provider Note   CSN: [id number] Arrival date & time: 10/13/21  1426     History  Chief Complaint  Patient presents with   Abdominal Pain   Post-op Problem    Amy Cowan is a 61 y.o. female who presents to the ED today with complaint of sudden onset, intermittent, sharp, upper abdominal/chest pain underneath bilateral breasts that began this morning.  Patient reports she recently had cholecystectomy done on 1/17 by Dr. Hassell Done.  She was discharged home 2 days ago and had been doing well until this morning.  She does admit that she had not had a bowel movement since being home and attributed her pain initially to gas.  She has been taking MiraLAX and states that later on this morning she had a bowel movement however continues to have the pain intermittently.  She states the pain lasts about 40 minutes and then will dissipate.  She complains of clamminess/sweatiness as well as nausea when the pain comes on.  No vomiting.  Denies any specific fevers or chills at home.  Patient is a current every day smoker however has not smoked since having her surgery.  Has been able to eat and drink at home without difficulty.  She does report that her drain output is darker in color as well however denies purulent drainage.   The history is provided by the patient, medical records and the spouse.      Home Medications Prior to Admission medications   Medication Sig Start Date End Date Taking? Authorizing Provider  docusate sodium (COLACE) 100 MG capsule Take 1 capsule (100 mg total) by mouth every 12 (twelve) hours. 10/13/21  Yes Kendry Pfarr, PA-C  ondansetron (ZOFRAN-ODT) 4 MG disintegrating tablet Take 1 tablet (4 mg total) by mouth every 8 (eight) hours as needed for nausea or vomiting. 10/13/21  Yes Alroy Bailiff, Yomara Toothman, PA-C  oxyCODONE-acetaminophen (PERCOCET/ROXICET) 5-325 MG tablet Take 1 tablet by mouth every 6 (six) hours as needed for severe pain.  10/13/21  Yes Jeris Easterly, PA-C  acetaminophen (TYLENOL) 325 MG tablet Take 2 tablets (650 mg total) by mouth every 6 (six) hours as needed for mild pain (or temp > 100). 10/10/21   Meuth, Blaine Hamper, PA-C  cholecalciferol (VITAMIN D3) 25 MCG (1000 UNIT) tablet Take 1,000 Units by mouth daily.    [provider]  Multiple Vitamins-Minerals (ZINC PO) Take 1 tablet by mouth daily.    [provider]  oxyCODONE (OXY IR/ROXICODONE) 5 MG immediate release tablet Take 1 tablet (5 mg total) by mouth every 6 (six) hours as needed for severe pain. 10/10/21   Meuth, Brooke A, PA-C  polyethylene glycol (MIRALAX / GLYCOLAX) 17 g packet Take 17 g by mouth daily as needed for mild constipation. 10/10/21   Meuth, Brooke A, PA-C  simvastatin (ZOCOR) 20 MG tablet Take 20 mg by mouth every evening. 07/26/18   [provider]  vitamin C (ASCORBIC ACID) 500 MG tablet Take 500 mg by mouth daily.    [provider]      Allergies    Atorvastatin    Review of Systems   Review of Systems  Constitutional:  Positive for diaphoresis. Negative for chills and fever.  Cardiovascular:  Positive for chest pain.  Gastrointestinal:  Positive for abdominal pain, constipation and nausea. Negative for diarrhea and vomiting.  All other systems reviewed and are negative.  Physical Exam Updated Vital Signs BP 135/82 (BP Location: Right Arm)  Pulse 72    Temp 97.6 F (36.4 C) (Oral)    Resp 20    LMP 07/10/2012    SpO2 95%  Physical Exam Vitals and nursing note reviewed.  Constitutional:      Appearance: She is not ill-appearing or diaphoretic.  HENT:     Head: Normocephalic and atraumatic.  Eyes:     Conjunctiva/sclera: Conjunctivae normal.  Cardiovascular:     Rate and Rhythm: Normal rate and regular rhythm.     Heart sounds: Normal heart sounds.  Pulmonary:     Effort: Pulmonary effort is normal.     Breath sounds: Normal breath sounds. No wheezing, rhonchi or rales.  Abdominal:      General: Abdomen is flat. Bowel sounds are normal.     Palpations: Abdomen is soft.     Tenderness: There is abdominal tenderness in the right upper quadrant, epigastric area and left upper quadrant.     Comments: Drain in place to RUQ. Maroon colored blood in drain, scant.   Musculoskeletal:     Cervical back: Neck supple.  Skin:    General: Skin is warm and dry.  Neurological:     Mental Status: She is alert.    ED Results / Procedures / Treatments   Labs (all labs ordered are listed, but only abnormal results are displayed) Labs Reviewed  COMPREHENSIVE METABOLIC PANEL - Abnormal; Notable for the following components:      Result Value   Glucose, Bld 121 (*)    Albumin 3.4 (*)    AST 85 (*)    ALT 93 (*)    Alkaline Phosphatase 157 (*)    All other components within normal limits  CBC - Abnormal; Notable for the following components:   Hemoglobin 11.4 (*)    HCT 33.8 (*)    All other components within normal limits  URINALYSIS, ROUTINE W REFLEX MICROSCOPIC - Abnormal; Notable for the following components:   APPearance CLOUDY (*)    All other components within normal limits  RESP PANEL BY RT-PCR (FLU A&B, COVID) ARPGX2  LIPASE, BLOOD  TROPONIN I (HIGH SENSITIVITY)  TROPONIN I (HIGH SENSITIVITY)    EKG None  Radiology CT Abdomen Pelvis W Contrast  Addendum Date: 10/13/2021   ADDENDUM REPORT: 10/13/2021 17:12 ADDENDUM: Following should be added to the impression. There is mild right hydronephrosis. There are no demonstrable renal or ureteral stones. This finding was relayed to patient's provider [name] by telephone call. Electronically Signed   By: Elmer Picker M.D.   On: 10/13/2021 17:12   Result Date: 10/13/2021 CLINICAL DATA:  Abdominal pain EXAM: CT ABDOMEN AND PELVIS WITH CONTRAST TECHNIQUE: Multidetector CT imaging of the abdomen and pelvis was performed using the standard protocol following bolus administration of intravenous contrast. RADIATION  DOSE REDUCTION: This exam was performed according to the departmental dose-optimization program which includes automated exposure control, adjustment of the mA and/or kV according to patient size and/or use of iterative reconstruction technique. CONTRAST:  121m OMNIPAQUE IOHEXOL 300 MG/ML  SOLN COMPARISON:  10/08/2021 FINDINGS: Lower chest: There are linear densities in both lower lung fields, more so on the right side suggesting subsegmental atelectasis. Hepatobiliary: No focal abnormality is seen in the liver. There is no dilation of bile ducts. Gallbladder is not seen consistent with history of cholecystectomy. There is 2.7 x 1.9 cm fluid density in the gallbladder fossa. There is linear area of stranding in the fat planes and small pockets of fluid along the inferior margin of  the liver measuring approximately 9.5 x 2.4 cm. Pancreas: No focal abnormality is seen. Spleen: Unremarkable. Adrenals/Urinary Tract: Adrenals are unremarkable. There is mild right hydronephrosis. There is prominence of left renal pelvis without significant dilation of minor calices. Ureters are unremarkable. There are no demonstrable renal or ureteral stones. Urinary bladder is not distended. Small portion of the urinary bladder is noted inferior to the pubic symphysis suggesting possible small cystocele. Stomach/Bowel: Small hiatal hernia is seen. Stomach is not distended. Small bowel loops are unremarkable. Cecum is low in position in the right pelvic cavity. Appendix is difficult to visualize. In the image 66 of series 2, there is a small caliber tubular structure with the air in the lumen, possibly normal appendix. There is no significant wall thickening in colon. There is no pericolic stranding or fluid collection. Vascular/Lymphatic: Unremarkable Reproductive: Unremarkable. Other: There is no ascites or pneumoperitoneum. There are small pockets of air in subcutaneous plane in the anterior abdominal wall which may be related to  recent surgery. There is a surgical drain entering the right lower abdomen with its tip between the liver and stomach close to midline. Musculoskeletal: There is mild decrease in height of upper endplate of body of V76 vertebra. Schmorl's node is seen in the upper endplate of H60 vertebral body. These findings have not changed significantly. IMPRESSION: There is no evidence of intestinal obstruction or pneumoperitoneum. There is no hydronephrosis. Appendix is not dilated. There is evidence interval cholecystectomy. There is 2.7 x 1.9 cm fluid collection in the gallbladder fossa, possibly postoperative seroma or early abscess. There is linear area of inflammatory stranding along with small loculated fluid collections adjacent to the inferior medial aspect of right lobe of liver. This may suggest postoperative inflammatory change or inflammatory phlegmon with small early abscesses. Short-term follow-up CT as clinically warranted should be considered. There are linear densities in both lower lung fields, more so on the right side suggesting subsegmental atelectasis. Small hiatal hernia. Other findings as described in the body of the report. Electronically Signed: By: Elmer Picker M.D. On: 10/13/2021 16:52   DG Chest Port 1 View  Result Date: 10/13/2021 CLINICAL DATA:  Severe abdominal pain. EXAM: PORTABLE CHEST 1 VIEW COMPARISON:  Chest radiograph 10/08/2021. FINDINGS: Normal cardiac and mediastinal contours. Elevation right hemidiaphragm. Minimal left basilar atelectasis. No pleural effusion or pneumothorax. IMPRESSION: No acute cardiopulmonary process. Electronically Signed   By: Lovey Newcomer M.D.   On: 10/13/2021 15:27    Procedures Procedures    Medications Ordered in ED Medications  ondansetron (ZOFRAN) injection 4 mg (4 mg Intravenous Given 10/13/21 1655)  morphine 4 MG/ML injection 4 mg (4 mg Intravenous Given 10/13/21 1657)  iohexol (OMNIPAQUE) 300 MG/ML solution 100 mL (100 mLs Intravenous  Contrast Given 10/13/21 1627)  fentaNYL (SUBLIMAZE) injection 50 mcg (50 mcg Intravenous Given 10/13/21 1757)  fentaNYL (SUBLIMAZE) injection 50 mcg (50 mcg Intravenous Given 10/13/21 1826)    ED Course/ Medical Decision Making/ A&P                            Medical Decision Making 61 year old female who presents to the ED today with complaint of intermittent diffuse upper abdominal pain/chest pain that began this morning.  Status post cholecystectomy with biliary drain placed on 1/17.  On arrival to the [facility] vitals are stable.  Patient appears to be no acute distress.  She is currently pain-free.  She is noted to have diffuse upper abdominal tenderness  palpation on exam.  She does have active bowel sounds.  Admits to have been constipated however had bowel movement today which was the first time since her surgery.  She denies any fevers or chills at home.  Given complaint of pain underneath breasts radiating into chest with nausea and diaphoresis we will plan for EKG and troponin.  We will plan for chest x-ray to rule out free air.  We will plan for CT abdomen and pelvis for further evaluation with concern for possible postop complication.  Problems Addressed: Post-op pain: acute illness or injury    Details: CT scan with findings of likely seroma near gallbladder fossa. Discussed case with general surgery who recommends outpatient follow up given pt has surgical drain in place. She was treated with pain medication in the ED however had to be placed on 2L [place] for a short time due to some decreased respiratory drive from the narcotics. SHe was monitored closely and weaned off of the O2. Attending physician Dr. Pearline Cables evaluated patient as well and recommends discharging with pain medication, dulcolax/miralax, and outpatient surgical followup. Husband and pt have been counseled on strict return precautions. They are both in agreement with plan and pt stable for discharge at this time.  Amount and/or  Complexity of Data Reviewed Labs: ordered.    Details: CBC without leukocytosis. Hgb 11.4 which appears to be consistent to previous CMP with slight elevation in LFTs with AST 85/ALT 93/Alk phos 157. T bili wnl at 0.4. No other electrolyte abnormalities.  LIpase WNL at 28 U/A without signs of infection Troponin of 4 Repeat troponin of 5 Radiology: ordered.    Details: CT: IMPRESSION:  There is no evidence of intestinal obstruction or pneumoperitoneum.  There is no hydronephrosis. Appendix is not dilated.   There is evidence interval cholecystectomy. There is 2.7 x 1.9 cm  fluid collection in the gallbladder fossa, possibly postoperative  seroma or early abscess. There is linear area of inflammatory  stranding along with small loculated fluid collections adjacent to  the inferior medial aspect of right lobe of liver. This may suggest  postoperative inflammatory change or inflammatory phlegmon with  small early abscesses. Short-term follow-up CT as clinically  warranted should be considered.   There are linear densities in both lower lung fields, more so on the  right side suggesting subsegmental atelectasis. Small hiatal hernia.   Other findings as described in the body of the report. ECG/medicine tests: ordered.    Details: NSR. No acute ischemic changes. Discussion of management or test interpretation with external provider(s): Discussed case with general surgeon Dr. Marlou Starks who evaluated CT scan. Given pt has drain in place does not think there is anything surgical to do at this time. Recommends outpatient follow up with Dr. Hassell Done who performed cholecystectomy earlier this week.   Risk OTC drugs. Prescription drug management.          Final Clinical Impression(s) / ED Diagnoses Final diagnoses:  Post-op pain    Rx / DC Orders ED Discharge Orders          Ordered    oxyCODONE-acetaminophen (PERCOCET/ROXICET) 5-325 MG tablet  Every 6 hours PRN        10/13/21 1853     docusate sodium (COLACE) 100 MG capsule  Every 12 hours        10/13/21 1854    ondansetron (ZOFRAN-ODT) 4 MG disintegrating tablet  Every 8 hours PRN        10/13/21 1921  Discharge Instructions      Please follow up with your surgeon for further evaluation of your pain  Pick up the percocet (oxycodone and tylenol) and take every 6 hours. It is also recommended that inbetween doses you are taking 600-800 mg Ibuprofen (3-4 OTC tablets) as well.   I have also prescribed a stool softener to take as prescribed. Please also add Miralax daily to your regimen and drink plenty of fluids to stay hydrated/help with constipation.   Return to the ED IMMEDIATELY for any new/worsening symptoms including worsening pain, vomiting, no bowel movement with the new regimen, not passing gas, fevers > 100.4, change in the drain output, or any other new/concerning symptoms.         [name], PA-C 16/42/90 3795    Lianne Cure, DO 58/31/67 1129

## 2021-10-13 NOTE — ED Notes (Signed)
Assisted pt to BR; tolerated well.

## 2021-10-13 NOTE — ED Triage Notes (Signed)
Pt reports she had her gallbladder removed Tuesday. Pt reports severe abdominal pain that started today. Denies fever

## 2021-10-13 NOTE — ED Notes (Signed)
ED Provider at bedside. Alroy Bailiff, PA at bedside to discuss CT results with pt

## 2021-10-13 NOTE — ED Notes (Signed)
O2 sats noted to drop to 83%; pt is more relaxed now and answers to verbal stimuli; O2 2L placed to supplement; EDP aware

## 2021-10-13 NOTE — ED Notes (Signed)
Pt in CT at this time.

## 2021-10-13 NOTE — Discharge Instructions (Addendum)
Please follow up with your surgeon for further evaluation of your pain  Pick up the percocet (oxycodone and tylenol) and take every 6 hours. It is also recommended that inbetween doses you are taking 600-800 mg Ibuprofen (3-4 OTC tablets) as well.   I have also prescribed a stool softener to take as prescribed. Please also add Miralax daily to your regimen and drink plenty of fluids to stay hydrated/help with constipation.   Return to the ED IMMEDIATELY for any new/worsening symptoms including worsening pain, vomiting, no bowel movement with the new regimen, not passing gas, fevers > 100.4, change in the drain output, or any other new/concerning symptoms.

## 2021-10-15 ENCOUNTER — Other Ambulatory Visit: Payer: Self-pay | Admitting: Student

## 2021-10-15 ENCOUNTER — Other Ambulatory Visit (HOSPITAL_COMMUNITY): Payer: Self-pay | Admitting: Student

## 2021-10-15 DIAGNOSIS — R1011 Right upper quadrant pain: Secondary | ICD-10-CM

## 2021-10-16 ENCOUNTER — Ambulatory Visit (HOSPITAL_COMMUNITY)
Admission: RE | Admit: 2021-10-16 | Discharge: 2021-10-16 | Disposition: A | Discharge status: Home / Self Care | Payer: BC Managed Care – PPO | Source: Ambulatory Visit | Attending: Student | Admitting: Student

## 2021-10-16 ENCOUNTER — Other Ambulatory Visit: Payer: Self-pay

## 2021-10-16 ENCOUNTER — Other Ambulatory Visit (HOSPITAL_COMMUNITY): Payer: Self-pay | Admitting: Student

## 2021-10-16 DIAGNOSIS — R109 Unspecified abdominal pain: Secondary | ICD-10-CM | POA: Diagnosis not present

## 2021-10-16 DIAGNOSIS — R1011 Right upper quadrant pain: Secondary | ICD-10-CM | POA: Insufficient documentation

## 2021-10-16 MED ORDER — TECHNETIUM TC 99M MEBROFENIN IV KIT
5.0000 | PACK | Freq: Once | INTRAVENOUS | Status: AC | PRN
  Administered 2021-10-16: 11:00:00 5 via INTRAVENOUS

## 2021-10-19 ENCOUNTER — Inpatient Hospital Stay (HOSPITAL_COMMUNITY)
Admission: AD | Admit: 2021-10-19 | Discharge: 2021-10-22 | DRG: 395 | Disposition: A | Discharge status: Home / Self Care | Payer: BC Managed Care – PPO | Source: Ambulatory Visit | Attending: General Surgery | Admitting: General Surgery

## 2021-10-19 ENCOUNTER — Other Ambulatory Visit (HOSPITAL_COMMUNITY): Payer: Self-pay | Admitting: Surgery

## 2021-10-19 DIAGNOSIS — Z9049 Acquired absence of other specified parts of digestive tract: Secondary | ICD-10-CM | POA: Diagnosis not present

## 2021-10-19 DIAGNOSIS — K839 Disease of biliary tract, unspecified: Secondary | ICD-10-CM | POA: Diagnosis present

## 2021-10-19 DIAGNOSIS — K219 Gastro-esophageal reflux disease without esophagitis: Secondary | ICD-10-CM | POA: Diagnosis not present

## 2021-10-19 DIAGNOSIS — Z825 Family history of asthma and other chronic lower respiratory diseases: Secondary | ICD-10-CM | POA: Diagnosis not present

## 2021-10-19 DIAGNOSIS — Z833 Family history of diabetes mellitus: Secondary | ICD-10-CM

## 2021-10-19 DIAGNOSIS — E785 Hyperlipidemia, unspecified: Secondary | ICD-10-CM | POA: Diagnosis present

## 2021-10-19 DIAGNOSIS — Z20822 Contact with and (suspected) exposure to covid-19: Secondary | ICD-10-CM | POA: Diagnosis present

## 2021-10-19 DIAGNOSIS — F17210 Nicotine dependence, cigarettes, uncomplicated: Secondary | ICD-10-CM | POA: Diagnosis present

## 2021-10-19 DIAGNOSIS — K832 Perforation of bile duct: Secondary | ICD-10-CM | POA: Diagnosis not present

## 2021-10-19 DIAGNOSIS — Z801 Family history of malignant neoplasm of trachea, bronchus and lung: Secondary | ICD-10-CM

## 2021-10-19 DIAGNOSIS — Z888 Allergy status to other drugs, medicaments and biological substances status: Secondary | ICD-10-CM

## 2021-10-19 DIAGNOSIS — Z8349 Family history of other endocrine, nutritional and metabolic diseases: Secondary | ICD-10-CM | POA: Diagnosis not present

## 2021-10-19 DIAGNOSIS — Z79899 Other long term (current) drug therapy: Secondary | ICD-10-CM

## 2021-10-19 DIAGNOSIS — E0590 Thyrotoxicosis, unspecified without thyrotoxic crisis or storm: Secondary | ICD-10-CM | POA: Diagnosis not present

## 2021-10-19 DIAGNOSIS — Y838 Other surgical procedures as the cause of abnormal reaction of the patient, or of later complication, without mention of misadventure at the time of the procedure: Secondary | ICD-10-CM | POA: Diagnosis not present

## 2021-10-19 DIAGNOSIS — K9189 Other postprocedural complications and disorders of digestive system: Secondary | ICD-10-CM | POA: Diagnosis not present

## 2021-10-19 DIAGNOSIS — K5900 Constipation, unspecified: Secondary | ICD-10-CM | POA: Diagnosis present

## 2021-10-19 DIAGNOSIS — K838 Other specified diseases of biliary tract: Secondary | ICD-10-CM | POA: Diagnosis not present

## 2021-10-19 DIAGNOSIS — R109 Unspecified abdominal pain: Secondary | ICD-10-CM

## 2021-10-19 LAB — COMPREHENSIVE METABOLIC PANEL
ALT: 113 U/L — ABNORMAL HIGH (ref 0–44)
AST: 79 U/L — ABNORMAL HIGH (ref 15–41)
Albumin: 4 g/dL (ref 3.5–5.0)
Alkaline Phosphatase: 218 U/L — ABNORMAL HIGH (ref 38–126)
Anion gap: 9 (ref 5–15)
BUN: 12 mg/dL (ref 6–20)
CO2: 24 mmol/L (ref 22–32)
Calcium: 9.5 mg/dL (ref 8.9–10.3)
Chloride: 98 mmol/L (ref 98–111)
Creatinine, Ser: 0.72 mg/dL (ref 0.44–1.00)
GFR, Estimated: >60 mL/min (ref >60)
Glucose, Bld: 106 mg/dL — ABNORMAL HIGH (ref 70–99)
Potassium: 4 mmol/L (ref 3.5–5.1)
Sodium: 131 mmol/L — ABNORMAL LOW (ref 135–145)
Total Bilirubin: 0.6 mg/dL (ref 0.3–1.2)
Total Protein: 7.2 g/dL (ref 6.5–8.1)

## 2021-10-19 LAB — CBC WITH DIFFERENTIAL/PLATELET
Abs Immature Granulocytes: 0.02 10*3/uL (ref 0.00–0.07)
Basophils Absolute: 0 10*3/uL (ref 0.0–0.1)
Basophils Relative: 1 %
Eosinophils Absolute: 0.1 10*3/uL (ref 0.0–0.5)
Eosinophils Relative: 2 %
HCT: 36.3 % (ref 36.0–46.0)
Hemoglobin: 12.1 g/dL (ref 12.0–15.0)
Immature Granulocytes: 0 %
Lymphocytes Relative: 24 %
Lymphs Abs: 1.5 10*3/uL (ref 0.7–4.0)
MCH: 29.5 pg (ref 26.0–34.0)
MCHC: 33.3 g/dL (ref 30.0–36.0)
MCV: 88.5 fL (ref 80.0–100.0)
Monocytes Absolute: 0.4 10*3/uL (ref 0.1–1.0)
Monocytes Relative: 7 %
Neutro Abs: 4.1 10*3/uL (ref 1.7–7.7)
Neutrophils Relative %: 66 %
Platelets: 390 10*3/uL (ref 150–400)
RBC: 4.1 MIL/uL (ref 3.87–5.11)
RDW: 12.8 % (ref 11.5–15.5)
WBC: 6.2 10*3/uL (ref 4.0–10.5)
nRBC: 0 % (ref 0.0–0.2)

## 2021-10-19 MED ORDER — KCL IN DEXTROSE-NACL 20-5-0.45 MEQ/L-%-% IV SOLN
INTRAVENOUS | Status: DC
  Filled 2021-10-19 (×6): qty 1000

## 2021-10-19 MED ORDER — HEPARIN SODIUM (PORCINE) 5000 UNIT/ML IJ SOLN
5000.0000 [IU] | Freq: Three times a day (TID) | INTRAMUSCULAR | Status: DC
  Administered 2021-10-19 – 2021-10-22 (×7): 5000 [IU] via SUBCUTANEOUS
  Filled 2021-10-19 (×7): qty 1

## 2021-10-19 MED ORDER — METOPROLOL TARTRATE 5 MG/5ML IV SOLN: 5.0000 mg | Freq: Four times a day (QID) | INTRAVENOUS | Status: DC | PRN

## 2021-10-19 MED ORDER — PANTOPRAZOLE SODIUM 40 MG IV SOLR
40.0000 mg | Freq: Every day | INTRAVENOUS | Status: DC
  Administered 2021-10-19 – 2021-10-21 (×3): 40 mg via INTRAVENOUS
  Filled 2021-10-19 (×3): qty 40

## 2021-10-19 MED ORDER — ONDANSETRON HCL 4 MG/2ML IJ SOLN
4.0000 mg | Freq: Four times a day (QID) | INTRAMUSCULAR | Status: DC | PRN
  Administered 2021-10-21: 4 mg via INTRAVENOUS

## 2021-10-19 MED ORDER — FENTANYL CITRATE PF 50 MCG/ML IJ SOSY
12.5000 ug | PREFILLED_SYRINGE | INTRAMUSCULAR | Status: DC | PRN
  Filled 2021-10-19: qty 1

## 2021-10-19 MED ORDER — ONDANSETRON 4 MG PO TBDP: 4.0000 mg | ORAL_TABLET | Freq: Four times a day (QID) | ORAL | Status: DC | PRN

## 2021-10-20 ENCOUNTER — Encounter (HOSPITAL_COMMUNITY): Payer: Self-pay

## 2021-10-20 ENCOUNTER — Other Ambulatory Visit: Payer: Self-pay

## 2021-10-20 DIAGNOSIS — K839 Disease of biliary tract, unspecified: Secondary | ICD-10-CM | POA: Diagnosis present

## 2021-10-20 DIAGNOSIS — Z20822 Contact with and (suspected) exposure to covid-19: Secondary | ICD-10-CM | POA: Diagnosis present

## 2021-10-20 DIAGNOSIS — E0590 Thyrotoxicosis, unspecified without thyrotoxic crisis or storm: Secondary | ICD-10-CM | POA: Diagnosis present

## 2021-10-20 DIAGNOSIS — Y838 Other surgical procedures as the cause of abnormal reaction of the patient, or of later complication, without mention of misadventure at the time of the procedure: Secondary | ICD-10-CM | POA: Diagnosis present

## 2021-10-20 DIAGNOSIS — K219 Gastro-esophageal reflux disease without esophagitis: Secondary | ICD-10-CM | POA: Diagnosis present

## 2021-10-20 DIAGNOSIS — Z8349 Family history of other endocrine, nutritional and metabolic diseases: Secondary | ICD-10-CM | POA: Diagnosis not present

## 2021-10-20 DIAGNOSIS — Z79899 Other long term (current) drug therapy: Secondary | ICD-10-CM | POA: Diagnosis not present

## 2021-10-20 DIAGNOSIS — Z833 Family history of diabetes mellitus: Secondary | ICD-10-CM | POA: Diagnosis not present

## 2021-10-20 DIAGNOSIS — K9189 Other postprocedural complications and disorders of digestive system: Secondary | ICD-10-CM | POA: Diagnosis present

## 2021-10-20 DIAGNOSIS — Z888 Allergy status to other drugs, medicaments and biological substances status: Secondary | ICD-10-CM | POA: Diagnosis not present

## 2021-10-20 DIAGNOSIS — F17210 Nicotine dependence, cigarettes, uncomplicated: Secondary | ICD-10-CM | POA: Diagnosis present

## 2021-10-20 DIAGNOSIS — Z801 Family history of malignant neoplasm of trachea, bronchus and lung: Secondary | ICD-10-CM | POA: Diagnosis not present

## 2021-10-20 DIAGNOSIS — Z825 Family history of asthma and other chronic lower respiratory diseases: Secondary | ICD-10-CM | POA: Diagnosis not present

## 2021-10-20 DIAGNOSIS — Z9049 Acquired absence of other specified parts of digestive tract: Secondary | ICD-10-CM | POA: Diagnosis not present

## 2021-10-20 DIAGNOSIS — E785 Hyperlipidemia, unspecified: Secondary | ICD-10-CM | POA: Diagnosis present

## 2021-10-20 DIAGNOSIS — K5900 Constipation, unspecified: Secondary | ICD-10-CM | POA: Diagnosis present

## 2021-10-20 MED ORDER — SODIUM CHLORIDE 0.9 % IV SOLN: INTRAVENOUS | Status: DC

## 2021-10-20 MED ORDER — IBUPROFEN 400 MG PO TABS: 600.0000 mg | ORAL_TABLET | Freq: Three times a day (TID) | ORAL | Status: DC | PRN

## 2021-10-20 MED ORDER — ACETAMINOPHEN 325 MG PO TABS
650.0000 mg | ORAL_TABLET | Freq: Four times a day (QID) | ORAL | Status: DC | PRN
  Administered 2021-10-20: 650 mg via ORAL
  Filled 2021-10-20: qty 2

## 2021-10-20 MED ORDER — FENTANYL CITRATE PF 50 MCG/ML IJ SOSY: 12.5000 ug | PREFILLED_SYRINGE | INTRAMUSCULAR | Status: DC | PRN

## 2021-10-20 NOTE — H&P (Signed)
Chief Complaint:  pain and HIDA demonstrated bile leak   History of Present Illness:  Amy Cowan is an 61 y.o. female who underwent a robotic cholecystectomy on Tuesday, January 17 for what proved to be an advance fibrotic cholecystitis.  After shelling out this chronically inflamed gallbladder the ICG demonstrated what is thought to be the cystic duct from within the funnel of the infundibulum.  This was oversewn with two vicryl sutures and a drain left in.  She has had intermittent pain since then that has gotten to be severe at times.  A HIDA scan revealed a trickle leak pooling in the right gutter.  She was seen by me in the office on Friday and bile is noted in her JP.  With the bile leak and her pain I offered admission for pain control and to have GI consultation for a bile leak with possible ERCP stent placement.   Past Medical History:  Diagnosis Date   Dyslipidemia    Hyperthyroidism     Past Surgical History:  Procedure Laterality Date   BUNIONECTOMY Right     Current Facility-Administered Medications  Medication Dose Route Frequency Provider Last Rate Last Admin   dextrose 5 % and 0.45 % NaCl with KCl 20 mEq/L infusion   Intravenous Continuous Johnathan Hausen, MD 100 mL/hr at 10/20/21 0720 New Bag at 10/20/21 0720   fentaNYL (SUBLIMAZE) injection 12.5 mcg  12.5 mcg Intravenous Q1H PRN Johnathan Hausen, MD       heparin injection 5,000 Units  5,000 Units Subcutaneous Q8H Johnathan Hausen, MD   5,000 Units at 10/20/21 0719   metoprolol tartrate (LOPRESSOR) injection 5 mg  5 mg Intravenous Q6H PRN Johnathan Hausen, MD       ondansetron (ZOFRAN-ODT) disintegrating tablet 4 mg  4 mg Oral Q6H PRN Johnathan Hausen, MD       Or   ondansetron [facility]) injection 4 mg  4 mg Intravenous Q6H PRN Johnathan Hausen, MD       pantoprazole (PROTONIX) injection 40 mg  40 mg Intravenous QHS Johnathan Hausen, MD   40 mg at 10/19/21 2200   Atorvastatin Family History  Problem Relation Age of Onset    Thyroid disease Son    Thyroid disease Mother    Lung cancer Mother    Thyroid disease Father    COPD Father    Diabetes Brother    Social History:   reports that she has been smoking. She has been smoking an average of 1 pack per day. She has never used smokeless tobacco. She reports current alcohol use. She reports that she does not use drugs.   REVIEW OF SYSTEMS : Negative except for see problem list  Physical Exam:   Blood pressure 110/71, pulse 72, temperature 98.3 F (36.8 C), temperature source Oral, resp. rate 18, last menstrual period 07/10/2012, SpO2 94 %. There is no height or weight on file to calculate BMI.  Gen:  WDWN WF NAD  Neurological: Alert and oriented to person, place, and time. Motor and sensory function is grossly intact  Head: Normocephalic and atraumatic.  Eyes: Conjunctivae are normal. Pupils are equal, round, and reactive to light. No scleral icterus.  Neck: Normal range of motion. Neck supple. No tracheal deviation or thyromegaly present.  Cardiovascular:  SR without murmurs or gallops.  No carotid bruits Breast:  not examined Respiratory: Effort normal.  No respiratory distress. No chest wall tenderness. Breath sounds normal.  No wheezes, rales or rhonchi.  Abdomen:  tenderness across  the abdomen and in to her back GU:  not examined Musculoskeletal: Normal range of motion. Extremities are nontender. No cyanosis, edema or clubbing noted Lymphadenopathy: No cervical, preauricular, postauricular or axillary adenopathy is present Skin: Skin is warm and dry. No rash noted. No diaphoresis. No erythema. No pallor. Pscyh: Normal mood and affect. Behavior is normal. Judgment and thought content normal.   LABORATORY RESULTS: Results for orders placed or performed during the hospital encounter of 10/19/21 (from the past 48 hour(s))  Comprehensive metabolic panel     Status: Abnormal   Collection Time: 10/19/21  8:31 PM  Result Value Ref Range   Sodium 131 (L)  135 - 145 mmol/L   Potassium 4.0 3.5 - 5.1 mmol/L   Chloride 98 98 - 111 mmol/L   CO2 24 22 - 32 mmol/L   Glucose, Bld 106 (H) 70 - 99 mg/dL    Comment: Glucose reference range applies only to samples taken after fasting for at least 8 hours.   BUN 12 6 - 20 mg/dL   Creatinine, Ser 0.72 0.44 - 1.00 mg/dL   Calcium 9.5 8.9 - 10.3 mg/dL   Total Protein 7.2 6.5 - 8.1 g/dL   Albumin 4.0 3.5 - 5.0 g/dL   AST 79 (H) 15 - 41 U/L   ALT 113 (H) 0 - 44 U/L   Alkaline Phosphatase 218 (H) 38 - 126 U/L   Total Bilirubin 0.6 0.3 - 1.2 mg/dL   GFR, Estimated >60 >60 mL/min    Comment: (NOTE) Calculated using the CKD-EPI Creatinine Equation (2021)    Anion gap 9 5 - 15    Comment: Performed at [facility], [place] [address] [place], [place] [address]  CBC WITH DIFFERENTIAL     Status: None   Collection Time: 10/19/21  8:31 PM  Result Value Ref Range   WBC 6.2 4.0 - 10.5 K/uL   RBC 4.10 3.87 - 5.11 MIL/uL   Hemoglobin 12.1 12.0 - 15.0 g/dL   HCT 36.3 36.0 - 46.0 %   MCV 88.5 80.0 - 100.0 fL   MCH 29.5 26.0 - 34.0 pg   MCHC 33.3 30.0 - 36.0 g/dL   RDW 12.8 11.5 - 15.5 %   Platelets 390 150 - 400 K/uL   nRBC 0.0 0.0 - 0.2 %   Neutrophils Relative % 66 %   Neutro Abs 4.1 1.7 - 7.7 K/uL   Lymphocytes Relative 24 %   Lymphs Abs 1.5 0.7 - 4.0 K/uL   Monocytes Relative 7 %   Monocytes Absolute 0.4 0.1 - 1.0 K/uL   Eosinophils Relative 2 %   Eosinophils Absolute 0.1 0.0 - 0.5 K/uL   Basophils Relative 1 %   Basophils Absolute 0.0 0.0 - 0.1 K/uL   Immature Granulocytes 0 %   Abs Immature Granulocytes 0.02 0.00 - 0.07 K/uL    Comment: Performed at [facility], [place] [address], [place], [place] [address]     RADIOLOGY RESULTS: No results found.  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  For GI consult for possible ERCP and stent placement.      Matt B. Hassell Done, MD, [facility]  [facility], [place]. [phone] beeper [phone]  10/20/2021 7:34 AM

## 2021-10-20 NOTE — Consult Note (Signed)
[facility] Gastroenterology Consult  Referring Provider: Dr. Martin/CCS Primary Care Physician:  Lujean Amel, MD Primary Gastroenterologist: [facility]  Reason for Consultation: Bile leak  HPI: Amy Cowan is a 61 y.o. female underwent robotic cholecystectomy on 10/09/2021 and was noted to have advanced fibrotic cholecystitis, had a JP drain placed intraoperatively. She was discharged on 10/10/2021 and developed severe, progressively worsening epigastric and upper abdominal pain from 10/13/2021.  Patient reports that JP drain gets filled with bilious fluid, especially overnight, she usually empties 10 to 50 cc every 3 hours.  She denies nausea or vomiting. She has not developed any fevers, chills or rigors. When she was admitted yesterday abdominal pain was severe, however is currently controlled with pain medications.  Patient has mild acid reflux but denies requiring antireflux medications. She denies difficulty swallowing or pain on swallowing. Denies recent unintentional weight loss or loss of appetite.  Since using pain medication she has been more constipated, has been using MiraLAX and stool softeners. Denies noticing blood in stool or black stools.   Last colonoscopy was performed in 2013, was incomplete. Barium enema from 07/2012 showed a very tortuous and elongated colon without polypoid or constricting lesion. Patient's maternal aunt has history of colon cancer in his 55s. She is a smoker.   Past Medical History:  Diagnosis Date   Dyslipidemia    Hyperthyroidism     Past Surgical History:  Procedure Laterality Date   BUNIONECTOMY Right     Prior to Admission medications   Medication Sig Start Date End Date Taking? Authorizing Provider  acetaminophen (TYLENOL) 325 MG tablet Take 2 tablets (650 mg total) by mouth every 6 (six) hours as needed for mild pain (or temp > 100). 10/10/21  Yes Meuth, Brooke A, PA-C  calcium carbonate (TUMS EX) 750 MG chewable tablet Chew 1  tablet by mouth daily as needed for heartburn.   Yes [provider]  cholecalciferol (VITAMIN D3) 25 MCG (1000 UNIT) tablet Take 1,000 Units by mouth daily.   Yes [provider]  docusate sodium (COLACE) 100 MG capsule Take 1 capsule (100 mg total) by mouth every 12 (twelve) hours. 10/13/21  Yes Venter, Margaux, PA-C  Multiple Vitamins-Minerals (ZINC PO) Take 1 tablet by mouth daily.   Yes [provider]  oxyCODONE (OXY IR/ROXICODONE) 5 MG immediate release tablet Take 1 tablet (5 mg total) by mouth every 6 (six) hours as needed for severe pain. 10/10/21  Yes Meuth, Brooke A, PA-C  oxyCODONE-acetaminophen (PERCOCET/ROXICET) 5-325 MG tablet Take 1 tablet by mouth every 6 (six) hours as needed for severe pain. 10/13/21  Yes Venter, Margaux, PA-C  polyethylene glycol (MIRALAX / GLYCOLAX) 17 g packet Take 17 g by mouth daily as needed for mild constipation. Patient taking differently: Take 17 g by mouth daily. 10/10/21  Yes Meuth, Brooke A, PA-C  simvastatin (ZOCOR) 20 MG tablet Take 20 mg by mouth every evening. 07/26/18  Yes [provider]  vitamin C (ASCORBIC ACID) 500 MG tablet Take 500 mg by mouth daily.   Yes [provider]  ondansetron (ZOFRAN-ODT) 4 MG disintegrating tablet Take 1 tablet (4 mg total) by mouth every 8 (eight) hours as needed for nausea or vomiting. Patient not taking: Reported on 10/19/2021 10/13/21   [name], PA-C    Current Facility-Administered Medications  Medication Dose Route Frequency Provider Last Rate Last Admin   acetaminophen (TYLENOL) tablet 650 mg  650 mg Oral Q6H PRN Stark Klein, MD  dextrose 5 % and 0.45 % NaCl with KCl 20 mEq/L infusion   Intravenous Continuous Johnathan Hausen, MD 100 mL/hr at 10/20/21 0720 New Bag at 10/20/21 0720   fentaNYL (SUBLIMAZE) injection 12.5-25 mcg  12.5-25 mcg Intravenous Q1H PRN Stark Klein, MD       heparin injection 5,000 Units  5,000 Units Subcutaneous Q8H Johnathan Hausen, MD   5,000 Units at 10/20/21 0719   ibuprofen (ADVIL) tablet 600 mg  600 mg Oral Q8H PRN Stark Klein, MD       metoprolol tartrate (LOPRESSOR) injection 5 mg  5 mg Intravenous Q6H PRN Johnathan Hausen, MD       ondansetron (ZOFRAN-ODT) disintegrating tablet 4 mg  4 mg Oral Q6H PRN Johnathan Hausen, MD       Or   ondansetron [facility]) injection 4 mg  4 mg Intravenous Q6H PRN Johnathan Hausen, MD       pantoprazole (PROTONIX) injection 40 mg  40 mg Intravenous QHS Johnathan Hausen, MD   40 mg at 10/19/21 2200    Allergies as of 10/19/2021 - Review Complete 10/19/2021  Allergen Reaction Noted   Atorvastatin Other (See Comments) 11/09/2020    Family History  Problem Relation Age of Onset   Thyroid disease Son    Thyroid disease Mother    Lung cancer Mother    Thyroid disease Father    COPD Father    Diabetes Brother     Social History   Socioeconomic History   Marital status: Single    Spouse name: Not on file   Number of children: Not on file   Years of education: Not on file   Highest education level: Not on file  Occupational History   Not on file  Tobacco Use   Smoking status: Heavy Smoker    Packs/day: 1.00    Types: Cigarettes   Smokeless tobacco: Never   Tobacco comments:    quarter pack daily-AH, 12/25/2020  Substance and Sexual Activity   Alcohol use: Yes    Comment: occ   Drug use: No   Sexual activity: Not on file  Other Topics Concern   Not on file  Social History Narrative   Not on file   Social Determinants of Health   Financial Resource Strain: Not on file  Food Insecurity: Not on file  Transportation Needs: Not on file  Physical Activity: Not on file  Stress: Not on file  Social Connections: Not on file  Intimate Partner Violence: Not on file    Review of Systems:  GI: Described in detail in HPI.    Gen: Denies any fever, chills, rigors, night sweats, anorexia, fatigue, weakness, malaise, involuntary weight loss, and sleep  disorder CV: Denies chest pain, angina, palpitations, syncope, orthopnea, PND, peripheral edema, and claudication. Resp: Denies dyspnea, cough, sputum, wheezing, coughing up blood. GU : Denies urinary burning, blood in urine, urinary frequency, urinary hesitancy, nocturnal urination, and urinary incontinence. MS: Denies joint pain or swelling.  Denies muscle weakness, cramps, atrophy.  Derm: Denies rash, itching, oral ulcerations, hives, unhealing ulcers.  Psych: Denies depression, anxiety, memory loss, suicidal ideation, hallucinations,  and confusion. Heme: Denies bruising, bleeding, and enlarged lymph nodes. Neuro:  Denies any headaches, dizziness, paresthesias. Endo:  Denies any problems with DM, thyroid, adrenal function.  Physical Exam: Vital signs in last 24 hours: Temp:  [98.2 F (36.8 C)-98.7 F (37.1 C)] 98.2 F (36.8 C) (01/28 0918) Pulse Rate:  [59-72] 67 (01/28 0918) Resp:  [14-19] 14 (01/28 0918)  BP: (98-115)/(70-77) 115/70 (01/28 0918) SpO2:  [93 %-96 %] 93 % (01/28 0918) Weight:  [81.2 kg] 81.2 kg (01/28 0918)    General:   Alert,  Well-developed, well-nourished, pleasant and cooperative in NAD Head:  Normocephalic and atraumatic. Eyes:  Sclera clear, no icterus.   Conjunctiva pink. Ears:  Normal auditory acuity. Nose:  No deformity, discharge,  or lesions. Mouth:  No deformity or lesions.  Oropharynx pink & moist. Neck:  Supple; no masses or thyromegaly. Lungs:  Clear throughout to auscultation.   No wheezes, crackles, or rhonchi. No acute distress. Heart:  Regular rate and rhythm; no murmurs, clicks, rubs,  or gallops. Extremities:  Without clubbing or edema. Neurologic:  Alert and  oriented x4;  grossly normal neurologically. Skin:  Intact without significant lesions or rashes. Psych:  Alert and cooperative. Normal mood and affect. Abdomen: JP drain in place with small amount of bilious fluid, recent surgical incision sites appear healing, soft, mild left lower  quadrant and epigastric tenderness, bowel sounds audible     Lab Results: Recent Labs    10/19/21 2031  WBC 6.2  HGB 12.1  HCT 36.3  PLT 390   BMET Recent Labs    10/19/21 2031  NA 131*  K 4.0  CL 98  CO2 24  GLUCOSE 106*  BUN 12  CREATININE 0.72  CALCIUM 9.5   LFT Recent Labs    10/19/21 2031  PROT 7.2  ALBUMIN 4.0  AST 79*  ALT 113*  ALKPHOS 218*  BILITOT 0.6   PT/INR No results for input(s): LABPROT, INR in the last 72 hours.  Studies/Results: No results found.  Impression: Bile leak status postcholecystectomy  T bili/AST/ALT/ALP of 0.6/79/113/218 Normal CBC Prior ultrasound from 10/08/2021: CBD obscured secondary to large stone at gallbladder neck measuring 6 to 7 mm  Plan: Currently on clear liquid diet, will keep her n.p.o. postmidnight for ERCP with stent placement in a.m.  She is on D5 half-normal saline with 20 M EQ KCl at 100 cc an hour. She is on fentanyl 12.5 to 25 mcg IV every 1 hour for as needed severe pain and on Protonix 40 mg IV at bedtime.  Discussed about the risks(pancreatitis, bleeding, perforation, anesthesia complications) and benefits of the procedure in details with the patient. She understands and verbalizes consent.   LOS: 1 day   Ronnette Juniper, MD  10/20/2021, 10:02 AM

## 2021-10-21 ENCOUNTER — Inpatient Hospital Stay (HOSPITAL_COMMUNITY): Payer: BC Managed Care – PPO | Admitting: Certified Registered Nurse Anesthetist

## 2021-10-21 ENCOUNTER — Encounter (HOSPITAL_COMMUNITY): Admission: AD | Disposition: A | Discharge status: Home / Self Care | Payer: Self-pay | Source: Ambulatory Visit

## 2021-10-21 ENCOUNTER — Inpatient Hospital Stay (HOSPITAL_COMMUNITY): Payer: BC Managed Care – PPO

## 2021-10-21 ENCOUNTER — Encounter (HOSPITAL_COMMUNITY): Payer: Self-pay

## 2021-10-21 HISTORY — PX: BILIARY STENT PLACEMENT: SHX5538

## 2021-10-21 HISTORY — PX: SPHINCTEROTOMY: SHX5279

## 2021-10-21 HISTORY — PX: ENDOSCOPIC RETROGRADE CHOLANGIOPANCREATOGRAPHY (ERCP) WITH PROPOFOL: SHX5810

## 2021-10-21 HISTORY — PX: REMOVAL OF STONES: SHX5545

## 2021-10-21 SURGERY — ENDOSCOPIC RETROGRADE CHOLANGIOPANCREATOGRAPHY (ERCP) WITH PROPOFOL
Anesthesia: General

## 2021-10-21 MED ORDER — OXYCODONE HCL 5 MG PO TABS: 5.0000 mg | ORAL_TABLET | Freq: Four times a day (QID) | ORAL | Status: DC | PRN

## 2021-10-21 MED ORDER — OXYCODONE HCL 5 MG PO TABS
5.0000 mg | ORAL_TABLET | Freq: Once | ORAL | Status: DC | PRN
  Filled 2021-10-21: qty 1

## 2021-10-21 MED ORDER — ACETAMINOPHEN 325 MG PO TABS
650.0000 mg | ORAL_TABLET | Freq: Four times a day (QID) | ORAL | Status: DC | PRN
  Administered 2021-10-22: 650 mg via ORAL
  Filled 2021-10-21: qty 2

## 2021-10-21 MED ORDER — CIPROFLOXACIN IN D5W 400 MG/200ML IV SOLN
INTRAVENOUS | Status: DC | PRN
  Administered 2021-10-21: 400 mg via INTRAVENOUS

## 2021-10-21 MED ORDER — ASCORBIC ACID 500 MG PO TABS
500.0000 mg | ORAL_TABLET | Freq: Every day | ORAL | Status: DC
  Administered 2021-10-21: 500 mg via ORAL
  Filled 2021-10-21 (×2): qty 1

## 2021-10-21 MED ORDER — DEXAMETHASONE SODIUM PHOSPHATE 4 MG/ML IJ SOLN
INTRAMUSCULAR | Status: DC | PRN
  Administered 2021-10-21: 5 mg via INTRAVENOUS

## 2021-10-21 MED ORDER — SUGAMMADEX SODIUM 200 MG/2ML IV SOLN
INTRAVENOUS | Status: DC | PRN
Start: 2021-10-21 — End: 2021-10-21
  Administered 2021-10-21: 200 mg via INTRAVENOUS

## 2021-10-21 MED ORDER — SIMVASTATIN 20 MG PO TABS
20.0000 mg | ORAL_TABLET | Freq: Every evening | ORAL | Status: DC
  Administered 2021-10-21: 20 mg via ORAL
  Filled 2021-10-21: qty 1

## 2021-10-21 MED ORDER — PROPOFOL 10 MG/ML IV BOLUS
INTRAVENOUS | Status: AC
  Filled 2021-10-21: qty 20

## 2021-10-21 MED ORDER — GLUCAGON HCL RDNA (DIAGNOSTIC) 1 MG IJ SOLR
INTRAMUSCULAR | Status: AC
  Filled 2021-10-21: qty 1

## 2021-10-21 MED ORDER — MEPERIDINE HCL 50 MG/ML IJ SOLN: 6.2500 mg | INTRAMUSCULAR | Status: DC | PRN

## 2021-10-21 MED ORDER — FENTANYL CITRATE (PF) 100 MCG/2ML IJ SOLN
INTRAMUSCULAR | Status: AC
  Filled 2021-10-21: qty 2

## 2021-10-21 MED ORDER — INDOMETHACIN 50 MG RE SUPP
RECTAL | Status: DC | PRN
  Administered 2021-10-21: 100 mg via RECTAL

## 2021-10-21 MED ORDER — FENTANYL CITRATE (PF) 100 MCG/2ML IJ SOLN: 25.0000 ug | INTRAMUSCULAR | Status: DC | PRN

## 2021-10-21 MED ORDER — PHENYLEPHRINE 40 MCG/ML (10ML) SYRINGE FOR IV PUSH (FOR BLOOD PRESSURE SUPPORT)
PREFILLED_SYRINGE | INTRAVENOUS | Status: DC | PRN
  Administered 2021-10-21 (×4): 120 ug via INTRAVENOUS

## 2021-10-21 MED ORDER — ONDANSETRON 4 MG PO TBDP: 4.0000 mg | ORAL_TABLET | Freq: Three times a day (TID) | ORAL | Status: DC | PRN

## 2021-10-21 MED ORDER — OXYCODONE-ACETAMINOPHEN 5-325 MG PO TABS: 1.0000 | ORAL_TABLET | Freq: Four times a day (QID) | ORAL | Status: DC | PRN

## 2021-10-21 MED ORDER — PROMETHAZINE HCL 25 MG/ML IJ SOLN: 6.2500 mg | INTRAMUSCULAR | Status: DC | PRN

## 2021-10-21 MED ORDER — ROCURONIUM BROMIDE 10 MG/ML (PF) SYRINGE
PREFILLED_SYRINGE | INTRAVENOUS | Status: DC | PRN
  Administered 2021-10-21: 50 mg via INTRAVENOUS

## 2021-10-21 MED ORDER — VITAMIN D 25 MCG (1000 UNIT) PO TABS
1000.0000 [IU] | ORAL_TABLET | Freq: Every day | ORAL | Status: DC
  Administered 2021-10-21: 1000 [IU] via ORAL
  Filled 2021-10-21 (×2): qty 1

## 2021-10-21 MED ORDER — DOCUSATE SODIUM 100 MG PO CAPS
100.0000 mg | ORAL_CAPSULE | Freq: Two times a day (BID) | ORAL | Status: DC
  Administered 2021-10-21 (×2): 100 mg via ORAL
  Filled 2021-10-21 (×2): qty 1

## 2021-10-21 MED ORDER — MIDAZOLAM HCL 2 MG/2ML IJ SOLN: 0.5000 mg | Freq: Once | INTRAMUSCULAR | Status: DC | PRN

## 2021-10-21 MED ORDER — POLYETHYLENE GLYCOL 3350 17 G PO PACK
17.0000 g | PACK | Freq: Every day | ORAL | Status: DC
  Administered 2021-10-21: 17 g via ORAL
  Filled 2021-10-21: qty 1

## 2021-10-21 MED ORDER — CIPROFLOXACIN IN D5W 400 MG/200ML IV SOLN
INTRAVENOUS | Status: AC
  Filled 2021-10-21: qty 200

## 2021-10-21 MED ORDER — INDOMETHACIN 50 MG RE SUPP
RECTAL | Status: AC
  Filled 2021-10-21: qty 1

## 2021-10-21 MED ORDER — CALCIUM CARBONATE ANTACID 500 MG PO CHEW
2.0000 | CHEWABLE_TABLET | Freq: Every day | ORAL | Status: DC | PRN
  Administered 2021-10-21: 400 mg via ORAL
  Filled 2021-10-21: qty 2

## 2021-10-21 MED ORDER — FENTANYL CITRATE (PF) 100 MCG/2ML IJ SOLN
INTRAMUSCULAR | Status: DC | PRN
  Administered 2021-10-21 (×4): 50 ug via INTRAVENOUS

## 2021-10-21 MED ORDER — PROPOFOL 10 MG/ML IV BOLUS
INTRAVENOUS | Status: DC | PRN
Start: 2021-10-21 — End: 2021-10-21
  Administered 2021-10-21: 120 mg via INTRAVENOUS
  Administered 2021-10-21: 30 mg via INTRAVENOUS

## 2021-10-21 MED ORDER — OXYCODONE HCL 5 MG/5ML PO SOLN
5.0000 mg | Freq: Once | ORAL | Status: DC | PRN
  Filled 2021-10-21: qty 5

## 2021-10-21 MED ORDER — LIDOCAINE 2% (20 MG/ML) 5 ML SYRINGE
INTRAMUSCULAR | Status: DC | PRN
  Administered 2021-10-21: 20 mg via INTRAVENOUS

## 2021-10-21 MED ORDER — SODIUM CHLORIDE 0.9 % IV SOLN: INTRAVENOUS | Status: DC | PRN

## 2021-10-21 MED ORDER — ZINC PO LOZG: LOZENGE | Freq: Every day | ORAL | Status: DC

## 2021-10-21 MED ORDER — LACTATED RINGERS IV SOLN: INTRAVENOUS | Status: DC | PRN

## 2021-10-21 MED ORDER — ADULT MULTIVITAMIN W/MINERALS CH
1.0000 | ORAL_TABLET | Freq: Every day | ORAL | Status: DC
  Administered 2021-10-21: 1 via ORAL
  Filled 2021-10-21 (×2): qty 1

## 2021-10-21 NOTE — Plan of Care (Signed)
°  Problem: Education: Goal: Knowledge of General Education information will improve Description: Including pain rating scale, medication(s)/side effects and non-pharmacologic comfort measures Outcome: Progressing   Problem: Health Behavior/Discharge Planning: Goal: Ability to manage health-related needs will improve Outcome: Progressing   Problem: Clinical Measurements: Goal: Ability to maintain clinical measurements within normal limits will improve Outcome: Progressing Goal: Will remain free from infection Outcome: Progressing Goal: Diagnostic test results will improve Outcome: Progressing Goal: Respiratory complications will improve Outcome: Progressing Goal: Cardiovascular complication will be avoided Outcome: Progressing   Problem: Activity: Goal: Risk for activity intolerance will decrease Outcome: Progressing   Problem: Pain Managment: Goal: General experience of comfort will improve Outcome: Progressing   Problem: Skin Integrity: Goal: Risk for impaired skin integrity will decrease Outcome: Progressing

## 2021-10-21 NOTE — Transfer of Care (Signed)
Immediate Anesthesia Transfer of Care Note  Patient: Amy Cowan  Procedure(s) Performed: ENDOSCOPIC RETROGRADE CHOLANGIOPANCREATOGRAPHY (ERCP) WITH PROPOFOL REMOVAL OF SLUDGE BILIARY STENT PLACEMENT  Patient Location: Endoscopy Unit  Anesthesia Type:General  Level of Consciousness: awake, alert , oriented and patient cooperative  Airway & Oxygen Therapy: Patient Spontanous Breathing and Patient connected to face mask  Post-op Assessment: Report given to RN and Post -op Vital signs reviewed and stable  Post vital signs: Reviewed and stable  Last Vitals:  Vitals Value Taken Time  BP 126/88 10/21/21 0845  Temp    Pulse 80 10/21/21 0845  Resp 21 10/21/21 0845  SpO2 94 % 10/21/21 0845    Last Pain:  Vitals:   10/21/21 0711  TempSrc: Oral  PainSc: 0-No pain         Complications: No notable events documented.

## 2021-10-21 NOTE — Progress Notes (Signed)
Day of Surgery   Subjective/Chief Complaint: Had ERCP with stent this morning Feels better now, minimal pain   Objective: Vital signs in last 24 hours: Temp:  [97.5 F (36.4 C)-98.7 F (37.1 C)] 97.5 F (36.4 C) (01/29 0910) Pulse Rate:  [61-83] 73 (01/29 0910) Resp:  [15-25] 16 (01/29 0910) BP: (111-126)/(67-88) 113/70 (01/29 0910) SpO2:  [93 %-100 %] 96 % (01/29 0910) Last BM Date: 10/19/21  Intake/Output from previous day: 01/28 0701 - 01/29 0700 In: 3665.1 [P.O.:1440; I.V.:2225.1] Out: 65 [Drains:65] Intake/Output this shift: Total I/O In: 300 [I.V.:300] Out: 0   Exam: Awake and alert Abdomen soft, NT, drain already less bilious  Lab Results:  Recent Labs    10/19/21 2031  WBC 6.2  HGB 12.1  HCT 36.3  PLT 390   BMET Recent Labs    10/19/21 2031  NA 131*  K 4.0  CL 98  CO2 24  GLUCOSE 106*  BUN 12  CREATININE 0.72  CALCIUM 9.5   PT/INR No results for input(s): LABPROT, INR in the last 72 hours. ABG No results for input(s): PHART, HCO3 in the last 72 hours.  Invalid input(s): PCO2, PO2  Studies/Results: DG ERCP WITH SPHINCTEROTOMY  Result Date: 10/21/2021 CLINICAL DATA:  Post cholecystectomy.  Bile leak. EXAM: ERCP COMPARISON:  NM HIDA 10/16/2021.  CT AP, 10/13/2021. FLUOROSCOPY TIME:  Fluoroscopy Time:  1 minute 43 seconds Radiation Exposure Index (if provided by the fluoroscopic device): 24.1 mGy Number of Acquired Spot Images: 1 fluoroscopic cine loop, with 10 images. FINDINGS: Multiple, limited oblique planar images of the RIGHT upper quadrant obtained C-arm. Images demonstrating flexible endoscopy, biliary duct cannulation, and retrograde cholangiogram. A RIGHT upper quadrant drain is present, and patent cystic duct with bile leak into the gallbladder fossa. See key image. IMPRESSION: Fluoroscopic imaging for ERCP. Patent cystic duct with exam positive for bile leak into the gallbladder fossa. For complete description of intra procedural  findings, please see performing service dictation. These results will be called to the ordering clinician or representative by the Radiologist Assistant, and communication documented in the PACS or [employer]. Electronically Signed   By: Michaelle Birks M.D.   On: 10/21/2021 09:18    Anti-infectives: Anti-infectives (From admission, onward)    None       Assessment/Plan: s/p Procedure(s): ENDOSCOPIC RETROGRADE CHOLANGIOPANCREATOGRAPHY (ERCP) WITH PROPOFOL (N/A) REMOVAL OF SLUDGE BILIARY STENT PLACEMENT (N/A) SPHINCTEROTOMY  S/p ERCP and stent for post op bile leak  Clear liquids started.    LOS: 2 days    Amy Cowan 10/21/2021

## 2021-10-21 NOTE — Anesthesia Postprocedure Evaluation (Signed)
Anesthesia Post Note  Patient: GEORGINE WILTSE  Procedure(s) Performed: ENDOSCOPIC RETROGRADE CHOLANGIOPANCREATOGRAPHY (ERCP) WITH PROPOFOL REMOVAL OF SLUDGE BILIARY STENT PLACEMENT     Patient location during evaluation: PACU Anesthesia Type: General Level of consciousness: awake and alert, patient cooperative and oriented Pain management: pain level controlled Vital Signs Assessment: post-procedure vital signs reviewed and stable Respiratory status: spontaneous breathing, nonlabored ventilation and respiratory function stable Cardiovascular status: blood pressure returned to baseline and stable Postop Assessment: no apparent nausea or vomiting Anesthetic complications: no   No notable events documented.  Last Vitals:  Vitals:   10/21/21 0845 10/21/21 0851  BP: 126/88 120/70  Pulse: 80 83  Resp: (!) 21 (!) 25  Temp: 36.8 C   SpO2: 94% 93%    Last Pain:  Vitals:   10/21/21 0845  TempSrc: Temporal  PainSc:                  Maxene Byington,E. Chandni Gagan

## 2021-10-21 NOTE — Op Note (Signed)
[facility] Patient Name: Amy Cowan Procedure Date: 10/21/2021 MRN: [id number] Attending MD: Ronnette Juniper , MD Date of Birth: 1961/07/23 CSN: [id number] Age: 61 Admit Type: Inpatient Procedure:                ERCP Indications:              Bile leak Providers:                Ronnette Juniper, MD, Doristine Johns, RN, Frazier Richards,                            Technician Referring MD:             Dr.Matt Hassell Done, CCS Medicines:                Monitored Anesthesia Care Complications:            No immediate complications. Estimated Blood Loss:     Estimated blood loss: none. Procedure:                Pre-Anesthesia Assessment:                           - Prior to the procedure, a History and Physical                            was performed, and patient medications and                            allergies were reviewed. The patient's tolerance of                            previous anesthesia was also reviewed. The risks                            and benefits of the procedure and the sedation                            options and risks were discussed with the patient.                            All questions were answered, and informed consent                            was obtained. Prior Anticoagulants: The patient has                            taken no previous anticoagulant or antiplatelet                            agents. ASA Grade Assessment: III - A patient with                            severe systemic disease. After reviewing the risks                            and  benefits, the patient was deemed in                            satisfactory condition to undergo the procedure.                           After obtaining informed consent, the scope was                            passed under direct vision. Throughout the                            procedure, the patient's blood pressure, pulse, and                            oxygen saturations were monitored  continuously. The                            [id number]-[id number] ([id number] Olympus duodenoscope was                            introduced through the mouth, and used to inject                            contrast into and used to inject contrast into the                            bile duct. The ERCP was accomplished without                            difficulty. The patient tolerated the procedure                            well. Scope In: Scope Out: Findings:      The scout film was normal except drainage catheter noted in the RUQ.      The esophagus was successfully intubated under direct vision. The scope       was advanced to a normal major papilla in the descending duodenum       without detailed examination of the pharynx, larynx and associated       structures, and upper GI tract. The upper GI tract was grossly normal.      The bile duct was deeply cannulated with the sphincterotome. Contrast       was injected. I personally interpreted the bile duct images. There was       brisk flow of contrast through the ducts. Image quality was excellent.       Contrast extended to the main bile duct.      Extravasation of contrast originating from the upper third of the main       bile duct was observed compatible with bile leak.      A straight Roadrunner wire was passed into the biliary tree. An 8 mm       biliary sphincterotomy was made with a braided sphincterotome using ERBE       electrocautery. There was no post-sphincterotomy bleeding.  The biliary tree was swept with a 9 mm and 12 mm balloon starting at the       bifurcation. Nothing was found.      One 10 Fr by 7 cm plastic stent with a single external flap and a single       internal flap was placed 6.5 cm into the common bile duct. The proximal       end of the stent was above the area of bile leak.      Bile flowed through the stent. The stent was in good position.      The pancreatic duct was not canulated or injected during the  procedure.      The patient was given IV ciprofloxacin 1 dose and rectal indomethacin       100 mg per rectal. Impression:               - A bile leak was found.                           - A biliary sphincterotomy was performed.                           - The biliary tree was swept and nothing was found.                           - One plastic stent was placed into the common bile                            duct. Moderate Sedation:      Patient did not receive moderate sedation for this procedure, but       instead received monitored anesthesia care. Recommendation:           - Advance diet as tolerated.                           - Monitor JP drain output.                           - Plan EGD as an outpatient for removal of plastic                            CBD stent, when bile leak resolves. Procedure Code(s):        --- Professional ---                           [phone], Endoscopic retrograde                            cholangiopancreatography (ERCP); with placement of                            endoscopic stent into biliary or pancreatic duct,                            including pre- and post-dilation and guide wire  passage, when performed, including sphincterotomy,                            when performed, each stent                           [phone], Endoscopic catheterization of the biliary                            ductal system, radiological supervision and                            interpretation Diagnosis Code(s):        --- Professional ---                           K83.9, Disease of biliary tract, unspecified                           K83.8, Other specified diseases of biliary tract CPT copyright 2019 American Medical Association. All rights reserved. The codes documented in this report are preliminary and upon coder review may  be revised to meet current compliance requirements. Ronnette Juniper, MD 10/21/2021 8:43:45 AM This report has been signed  electronically. Number of Addenda: 0

## 2021-10-21 NOTE — Anesthesia Preprocedure Evaluation (Addendum)
Anesthesia Evaluation  Patient identified by MRN, date of birth, ID band Patient awake    Reviewed: Allergy & Precautions, NPO status , Patient's Chart, lab work & pertinent test results  History of Anesthesia Complications Negative for: history of anesthetic complications  Airway Mallampati: II  TM Distance: >3 FB Neck ROM: Full    Dental  (+) Dental Advisory Given   Pulmonary Current Smoker and Patient abstained from smoking.,    breath sounds clear to auscultation       Cardiovascular negative cardio ROS   Rhythm:Regular Rate:Normal     Neuro/Psych negative neurological ROS     GI/Hepatic negative GI ROS, Neg liver ROS, Elevated LFTs, post Cholecystectomy bile leak    Endo/Other  Hyperthyroidism Morbid obesity  Renal/GU negative Renal ROS     Musculoskeletal   Abdominal (+) + obese,   Peds  Hematology negative hematology ROS (+)   Anesthesia Other Findings   Reproductive/Obstetrics                            Anesthesia Physical Anesthesia Plan  ASA: 3  Anesthesia Plan: General   Post-op Pain Management: Minimal or no pain anticipated   Induction: Intravenous  PONV Risk Score and Plan: 2 and Ondansetron and Dexamethasone  Airway Management Planned: Oral ETT  Additional Equipment:   Intra-op Plan:   Post-operative Plan: Extubation in OR  Informed Consent: I have reviewed the patients History and Physical, chart, labs and discussed the procedure including the risks, benefits and alternatives for the proposed anesthesia with the patient or authorized representative who has indicated his/her understanding and acceptance.     Dental advisory given  Plan Discussed with: CRNA and Surgeon  Anesthesia Plan Comments:        Anesthesia Quick Evaluation

## 2021-10-21 NOTE — Anesthesia Procedure Notes (Signed)
Procedure Name: Intubation Date/Time: 10/21/2021 8:04 AM Performed by: Claudia Desanctis, CRNA Pre-anesthesia Checklist: Patient identified, Emergency Drugs available, Suction available and Patient being monitored Patient Re-evaluated:Patient Re-evaluated prior to induction Oxygen Delivery Method: Circle system utilized Preoxygenation: Pre-oxygenation with 100% oxygen Induction Type: IV induction Ventilation: Mask ventilation without difficulty Laryngoscope Size: 2 and Miller Grade View: Grade I Tube type: Oral Tube size: 7.0 mm Number of attempts: 1 Airway Equipment and Method: Stylet Placement Confirmation: ETT inserted through vocal cords under direct vision, positive ETCO2 and breath sounds checked- equal and bilateral Secured at: 21 cm Tube secured with: Tape Dental Injury: Teeth and Oropharynx as per pre-operative assessment

## 2021-10-22 ENCOUNTER — Encounter (HOSPITAL_COMMUNITY): Payer: Self-pay | Admitting: Gastroenterology

## 2021-10-22 MED ORDER — OXYCODONE HCL 5 MG PO TABS: 5.0000 mg | ORAL_TABLET | Freq: Four times a day (QID) | ORAL | 0 refills | Status: AC | PRN

## 2021-10-22 MED ORDER — DOCUSATE SODIUM 100 MG PO CAPS: 100.0000 mg | ORAL_CAPSULE | Freq: Every day | ORAL | Status: DC | PRN

## 2021-10-22 MED ORDER — POLYETHYLENE GLYCOL 3350 17 G PO PACK: 17.0000 g | PACK | Freq: Every day | ORAL | Status: DC | PRN

## 2021-10-22 MED ORDER — SIMETHICONE 80 MG PO CHEW
80.0000 mg | CHEWABLE_TABLET | Freq: Four times a day (QID) | ORAL | Status: DC
  Administered 2021-10-22: 80 mg via ORAL
  Filled 2021-10-22: qty 1

## 2021-10-22 NOTE — Progress Notes (Addendum)
[facility] Progress Note  Amy Cowan 61 y.o. [birth year]  CC:  Bile leak status postcholecystectomy   Subjective: Patient states she just ate her first meal of "solid food." Ate grits and tolerated it well. States she has been having "gas pains", similar in location to prior to her surgery though much less mild. Mainly epigastric pain that radiates to LUQ. Feels better if she passes gas. Patient has had a few small diarrhea bowel movements this morning. Denies nausea/vomiting.  ROS : Review of Systems  Gastrointestinal:  Positive for abdominal pain and diarrhea. Negative for blood in stool, constipation, heartburn, melena, nausea and vomiting.  Genitourinary:  Negative for dysuria and urgency.     Objective: Vital signs in last 24 hours: Vitals:   10/21/21 2209 10/22/21 0538  BP: 103/60 121/66  Pulse: (!) 58 71  Resp: 18 17  Temp: 98.4 F (36.9 C) 98.3 F (36.8 C)  SpO2: 94% 95%    Physical Exam:  General:  Alert, cooperative, no distress, appears stated age  Head:  Normocephalic, without obvious abnormality, atraumatic  Eyes:  Anicteric sclera, EOM's intact  Lungs:   Clear to auscultation bilaterally, respirations unlabored  Heart:  Regular rate and rhythm, S1, S2 normal  Abdomen:   Soft, non-tender, bowel sounds active all four quadrants,  no masses,     Lab Results: Recent Labs    10/19/21 2031  NA 131*  K 4.0  CL 98  CO2 24  GLUCOSE 106*  BUN 12  CREATININE 0.72  CALCIUM 9.5   Recent Labs    10/19/21 2031  AST 79*  ALT 113*  ALKPHOS 218*  BILITOT 0.6  PROT 7.2  ALBUMIN 4.0   Recent Labs    10/19/21 2031  WBC 6.2  NEUTROABS 4.1  HGB 12.1  HCT 36.3  MCV 88.5  PLT 390   No results for input(s): LABPROT, INR in the last 72 hours.    Assessment Bile leak status postcholecystectomy - ERCP 1/29: bile leak was found, biliary sphincterotomy was performed. One plastic was placed in the common bile duct. - AST 79 (85)/ ALT 113  (93)/ Alk Phos 218 (157) -T bili 0.6 - hgb 12.1 -No leukocytosis   Plan: - Patient tolerating diet well. - Was put on simethicone per surgery to help with gas pains, other gas pains are improved from prior to ERCP. - ALT and alk phos still mildly elevated.  AST trending down.  T bili normal. - Continue to trend LFTs to normalization - Continue to encourage ambulation - Surgical team planning to discharge patient later today, as long as she is still doing well we are okay with discharge from GI standpoint with outpatient surgical follow up. - [facility] will sign off. Please contact us if we can be of any further assistance during this hospital stay.    Garnette Scheuermann PA-C 10/22/2021, 8:04 AM  Contact #  [phone]

## 2021-10-22 NOTE — Progress Notes (Signed)
Progress Note  1 Day Post-Op  Subjective: Pt reports she is still having some epigastric pain that radiates to LUQ but this is improving from her initial pain on Friday. She denies n/v. She is having diarrhea. She has been up ambulating.   Objective: Vital signs in last 24 hours: Temp:  [97.5 F (36.4 C)-98.8 F (37.1 C)] 98 F (36.7 C) (01/30 0812) Pulse Rate:  [58-83] 64 (01/30 0812) Resp:  [16-25] 16 (01/30 0812) BP: (103-133)/(60-88) 117/75 (01/30 0812) SpO2:  [93 %-99 %] 97 % (01/30 0812) Last BM Date: 10/21/21  Intake/Output from previous day: 01/29 0701 - 01/30 0700 In: 2639.5 [P.O.:1320; I.V.:1319.5] Out: 17 [Urine:1; Drains:15; Stool:1] Intake/Output this shift: No intake/output data recorded.  PE: General: pleasant, WD, overweight female who is sitting up in NAD HEENT: sclera anicteric Heart: regular, rate, and rhythm.   Lungs: Respiratory effort nonlabored Abd: soft, mild ttp in LUQ without peritonitis, ND, +BS, incisions C/D/I, drain with thin bilious fluid  MS: all 4 extremities are symmetrical with no cyanosis, clubbing, or edema. Psych: A&Ox3 with an appropriate affect.    Lab Results:  Recent Labs    10/19/21 2031  WBC 6.2  HGB 12.1  HCT 36.3  PLT 390   BMET Recent Labs    10/19/21 2031  NA 131*  K 4.0  CL 98  CO2 24  GLUCOSE 106*  BUN 12  CREATININE 0.72  CALCIUM 9.5   PT/INR No results for input(s): LABPROT, INR in the last 72 hours. CMP     Component Value Date/Time   NA 131 (L) 10/19/2021 2031   K 4.0 10/19/2021 2031   CL 98 10/19/2021 2031   CO2 24 10/19/2021 2031   GLUCOSE 106 (H) 10/19/2021 2031   BUN 12 10/19/2021 2031   CREATININE 0.72 10/19/2021 2031   CALCIUM 9.5 10/19/2021 2031   PROT 7.2 10/19/2021 2031   ALBUMIN 4.0 10/19/2021 2031   AST 79 (H) 10/19/2021 2031   ALT 113 (H) 10/19/2021 2031   ALKPHOS 218 (H) 10/19/2021 2031   BILITOT 0.6 10/19/2021 2031   GFRNONAA >60 10/19/2021 2031   Lipase      Component Value Date/Time   LIPASE 28 10/13/2021 1529       Studies/Results: DG ERCP WITH SPHINCTEROTOMY  Result Date: 10/21/2021 CLINICAL DATA:  Post cholecystectomy.  Bile leak. EXAM: ERCP COMPARISON:  NM HIDA 10/16/2021.  CT AP, 10/13/2021. FLUOROSCOPY TIME:  Fluoroscopy Time:  1 minute 43 seconds Radiation Exposure Index (if provided by the fluoroscopic device): 24.1 mGy Number of Acquired Spot Images: 1 fluoroscopic cine loop, with 10 images. FINDINGS: Multiple, limited oblique planar images of the RIGHT upper quadrant obtained C-arm. Images demonstrating flexible endoscopy, biliary duct cannulation, and retrograde cholangiogram. A RIGHT upper quadrant drain is present, and patent cystic duct with bile leak into the gallbladder fossa. See key image. IMPRESSION: Fluoroscopic imaging for ERCP. Patent cystic duct with exam positive for bile leak into the gallbladder fossa. For complete description of intra procedural findings, please see performing service dictation. These results will be called to the ordering clinician or representative by the Radiologist Assistant, and communication documented in the PACS or [employer]. Electronically Signed   By: Michaelle Birks M.D.   On: 10/21/2021 09:18    Anti-infectives: Anti-infectives (From admission, onward)    None        Assessment/Plan S/p robotic cholecystectomy 10/09/21 Dr. Hassell Done Post-op bile leak - s/p ERCP with stent placement 1/29 - patient tolerating regular  diet so far this AM and having diarrhea - having some gas pains but improved from prior to ERCP - will schedule simethicone this AM - Tbili normal and AST trending down, ALT and Alk Phos mildly elevated  - surgical drain with bilious fluid still - leave drain and continue to monitor  - GI to see today as well  - possibly home this afternoon if still doing well and GI ok with discharge also   FEN: reg diet, decreased IVF to 50 cc/h VTE: SQH ID: no current  abx  Hyperthyroidism HLD  LOS: 3 days   I reviewed [facility] notes, last 24 h vitals and pain scores, last 48 h intake and output, last 24 h labs and trends, and last 24 h imaging results.  This care required low level of medical decision making.    Norm Parcel, [facility] 10/22/2021, 8:35 AM Please see Amion for pager number during day hours 7:00am-4:30pm

## 2021-10-22 NOTE — Plan of Care (Signed)
°  Problem: Education: Goal: Knowledge of General Education information will improve Description: Including pain rating scale, medication(s)/side effects and non-pharmacologic comfort measures Outcome: Adequate for Discharge   Problem: Health Behavior/Discharge Planning: Goal: Ability to manage health-related needs will improve Outcome: Adequate for Discharge   Problem: Clinical Measurements: Goal: Ability to maintain clinical measurements within normal limits will improve Outcome: Adequate for Discharge Goal: Will remain free from infection Outcome: Adequate for Discharge Goal: Diagnostic test results will improve Outcome: Adequate for Discharge Goal: Respiratory complications will improve Outcome: Adequate for Discharge Goal: Cardiovascular complication will be avoided Outcome: Adequate for Discharge   Problem: Activity: Goal: Risk for activity intolerance will decrease Outcome: Adequate for Discharge   Problem: Pain Managment: Goal: General experience of comfort will improve Outcome: Adequate for Discharge   Problem: Skin Integrity: Goal: Risk for impaired skin integrity will decrease Outcome: Adequate for Discharge

## 2021-10-22 NOTE — Discharge Summary (Signed)
[facility] Discharge Summary   Patient ID: Amy Cowan MRN: [id number] DOB/AGE: [birth year] 61 y.o.  Admit date: 10/19/2021 Discharge date: 10/22/2021  Admitting Diagnosis: Postoperative bile leak  Discharge Diagnosis Postoperative bile leak s/p stent placement   [facility] Gastroenterology  Imaging: DG ERCP WITH SPHINCTEROTOMY  Result Date: 10/21/2021 CLINICAL DATA:  Post cholecystectomy.  Bile leak. EXAM: ERCP COMPARISON:  NM HIDA 10/16/2021.  CT AP, 10/13/2021. FLUOROSCOPY TIME:  Fluoroscopy Time:  1 minute 43 seconds Radiation Exposure Index (if provided by the fluoroscopic device): 24.1 mGy Number of Acquired Spot Images: 1 fluoroscopic cine loop, with 10 images. FINDINGS: Multiple, limited oblique planar images of the RIGHT upper quadrant obtained C-arm. Images demonstrating flexible endoscopy, biliary duct cannulation, and retrograde cholangiogram. A RIGHT upper quadrant drain is present, and patent cystic duct with bile leak into the gallbladder fossa. See key image. IMPRESSION: Fluoroscopic imaging for ERCP. Patent cystic duct with exam positive for bile leak into the gallbladder fossa. For complete description of intra procedural findings, please see performing service dictation. These results will be called to the ordering clinician or representative by the Radiologist Assistant, and communication documented in the PACS or [employer]. Electronically Signed   By: Michaelle Birks M.D.   On: 10/21/2021 09:18    Procedures Dr. Johnathan Hausen (10/09/21) - Robotic Cholecystectomy with ICG Dr. Ronnette Juniper (10/21/21) - ERCP with stent placement   Hospital Course:  Patient is a 61 year old female s/p robotic cholecystectomy who presented to [place] office with abdominal pain and dark drainage in surgical drain.  Workup showed bile leak.  Patient was admitted and GI was consulted for ERCP. Patient underwent ERCP as listed above. Tolerated procedure well and was transferred to  the floor.  Diet was advanced as tolerated.  On 10/22/21, the patient was voiding well, tolerating diet, ambulating well, pain well controlled, vital signs stable, incisions c/d/i and felt stable for discharge home with drain.  Patient will follow up in our office in ~1 week and knows to call with questions or concerns.  She will call to confirm appointment date/time. She will call to schedule GI follow up as well.  I or a member of my team have reviewed this patient in the Controlled Substance Database.   Allergies as of 10/22/2021       Reactions   Atorvastatin Other (See Comments)   Muscle ache        Medication List     STOP taking these medications    oxyCODONE-acetaminophen 5-325 MG tablet Commonly known as: PERCOCET/ROXICET       TAKE these medications    acetaminophen 325 MG tablet Commonly known as: TYLENOL Take 2 tablets (650 mg total) by mouth every 6 (six) hours as needed for mild pain (or temp > 100).   calcium carbonate 750 MG chewable tablet Commonly known as: TUMS EX Chew 1 tablet by mouth daily as needed for heartburn.   cholecalciferol 25 MCG (1000 UNIT) tablet Commonly known as: VITAMIN D3 Take 1,000 Units by mouth daily.   docusate sodium 100 MG capsule Commonly known as: COLACE Take 1 capsule (100 mg total) by mouth every 12 (twelve) hours.   ondansetron 4 MG disintegrating tablet Commonly known as: ZOFRAN-ODT Take 1 tablet (4 mg total) by mouth every 8 (eight) hours as needed for nausea or vomiting.   oxyCODONE 5 MG immediate release tablet Commonly known as: Roxicodone Take 1 tablet (5 mg total) by mouth every 6 (six) hours as needed.  What changed: reasons to take this   polyethylene glycol 17 g packet Commonly known as: MIRALAX / GLYCOLAX Take 17 g by mouth daily as needed for mild constipation. What changed: when to take this   simvastatin 20 MG tablet Commonly known as: ZOCOR Take 20 mg by mouth every evening.   vitamin C 500 MG  tablet Commonly known as: ASCORBIC ACID Take 500 mg by mouth daily.   ZINC PO Take 1 tablet by mouth daily.          Follow-up Information     [facility] Go on 11/01/2021.   Specialty: General Surgery Why: 9:15 AM with Dr. Hassell Done. Please arrive 15 min prior to appointment time. Contact information: [address] [place] [address] [phone]         Ronnette Juniper, MD. Call.   Specialty: Gastroenterology Why: To make an appointment for ERCP followup Contact information: [place] [address] [phone]                 Signed: Norm Parcel , [facility] 10/22/2021, 1:09 PM Please see Amion for pager number during day hours 7:00am-4:30pm

## 2021-10-22 NOTE — Plan of Care (Signed)
°  Problem: Pain Managment: Goal: General experience of comfort will improve Outcome: Not Progressing   Problem: Clinical Measurements: Goal: Will remain free from infection Outcome: Not Progressing

## 2021-10-24 DIAGNOSIS — L03114 Cellulitis of left upper limb: Secondary | ICD-10-CM | POA: Diagnosis not present

## 2021-11-05 ENCOUNTER — Other Ambulatory Visit: Payer: Self-pay | Admitting: Gastroenterology

## 2021-11-22 ENCOUNTER — Encounter (HOSPITAL_COMMUNITY): Payer: Self-pay | Admitting: Gastroenterology

## 2021-11-26 DIAGNOSIS — R7303 Prediabetes: Secondary | ICD-10-CM | POA: Diagnosis not present

## 2021-11-26 DIAGNOSIS — E0590 Thyrotoxicosis, unspecified without thyrotoxic crisis or storm: Secondary | ICD-10-CM | POA: Diagnosis not present

## 2021-11-26 DIAGNOSIS — Z0000 Encounter for general adult medical examination without abnormal findings: Secondary | ICD-10-CM | POA: Diagnosis not present

## 2021-11-26 DIAGNOSIS — Z23 Encounter for immunization: Secondary | ICD-10-CM | POA: Diagnosis not present

## 2021-11-26 DIAGNOSIS — E7800 Pure hypercholesterolemia, unspecified: Secondary | ICD-10-CM | POA: Diagnosis not present

## 2021-11-26 DIAGNOSIS — R7309 Other abnormal glucose: Secondary | ICD-10-CM | POA: Diagnosis not present

## 2021-11-30 ENCOUNTER — Encounter (HOSPITAL_COMMUNITY)
Admission: RE | Disposition: A | Discharge status: Home / Self Care | Payer: Self-pay | Source: Home / Self Care | Attending: Gastroenterology

## 2021-11-30 ENCOUNTER — Ambulatory Visit (HOSPITAL_COMMUNITY): Payer: BC Managed Care – PPO | Admitting: Certified Registered Nurse Anesthetist

## 2021-11-30 ENCOUNTER — Encounter (HOSPITAL_COMMUNITY): Payer: Self-pay | Admitting: Gastroenterology

## 2021-11-30 ENCOUNTER — Ambulatory Visit (HOSPITAL_COMMUNITY)
Admission: RE | Admit: 2021-11-30 | Discharge: 2021-11-30 | Disposition: A | Discharge status: Home / Self Care | Payer: BC Managed Care – PPO | Attending: Gastroenterology | Admitting: Gastroenterology

## 2021-11-30 ENCOUNTER — Other Ambulatory Visit: Payer: Self-pay

## 2021-11-30 DIAGNOSIS — K2270 Barrett's esophagus without dysplasia: Secondary | ICD-10-CM | POA: Diagnosis not present

## 2021-11-30 DIAGNOSIS — Z4659 Encounter for fitting and adjustment of other gastrointestinal appliance and device: Secondary | ICD-10-CM | POA: Diagnosis not present

## 2021-11-30 DIAGNOSIS — K839 Disease of biliary tract, unspecified: Secondary | ICD-10-CM | POA: Diagnosis not present

## 2021-11-30 DIAGNOSIS — F17210 Nicotine dependence, cigarettes, uncomplicated: Secondary | ICD-10-CM | POA: Diagnosis not present

## 2021-11-30 DIAGNOSIS — K2289 Other specified disease of esophagus: Secondary | ICD-10-CM | POA: Diagnosis not present

## 2021-11-30 HISTORY — PX: GASTROINTESTINAL STENT REMOVAL: SHX6384

## 2021-11-30 HISTORY — PX: ESOPHAGOGASTRODUODENOSCOPY (EGD) WITH PROPOFOL: SHX5813

## 2021-11-30 HISTORY — PX: BIOPSY: SHX5522

## 2021-11-30 SURGERY — ESOPHAGOGASTRODUODENOSCOPY (EGD) WITH PROPOFOL
Anesthesia: Monitor Anesthesia Care

## 2021-11-30 MED ORDER — PROPOFOL 500 MG/50ML IV EMUL
INTRAVENOUS | Status: DC | PRN
  Administered 2021-11-30: 175 ug/kg/min via INTRAVENOUS

## 2021-11-30 MED ORDER — SODIUM CHLORIDE 0.9 % IV SOLN: INTRAVENOUS | Status: DC

## 2021-11-30 MED ORDER — PROPOFOL 500 MG/50ML IV EMUL
INTRAVENOUS | Status: DC | PRN
Start: 2021-11-30 — End: 2021-11-30
  Administered 2021-11-30: 20 mg via INTRAVENOUS

## 2021-11-30 MED ORDER — LACTATED RINGERS IV SOLN
INTRAVENOUS | Status: DC
Start: 2021-11-30 — End: 2021-11-30
  Administered 2021-11-30: 1000 mL via INTRAVENOUS

## 2021-11-30 SURGICAL SUPPLY — 15 items

## 2021-11-30 NOTE — Op Note (Signed)
[facility] ?Patient Name: Amy Cowan ?Procedure Date: 11/30/2021 ?MRN: [id number] ?Attending MD: Ronnette Juniper , MD ?Date of Birth: March 12, 1961 ?CSN: [id number] ?Age: 61 ?Admit Type: Inpatient ?Procedure:                Upper GI endoscopy ?Indications:              Biliary stent removal ?Providers:                Ronnette Juniper, MD, Burtis Junes, RN, Janie Billups,  ?                          Technician, Eliberto Ivory ?Referring MD:             Dr.Martin, CCS ?Medicines:                Monitored Anesthesia Care ?Complications:            No immediate complications. Estimated blood loss:  ?                          Minimal. ?Estimated Blood Loss:     Estimated blood loss was minimal. ?Procedure:                Pre-Anesthesia Assessment: ?                          - Prior to the procedure, a History and Physical  ?                          was performed, and patient medications and  ?                          allergies were reviewed. The patient's tolerance of  ?                          previous anesthesia was also reviewed. The risks  ?                          and benefits of the procedure and the sedation  ?                          options and risks were discussed with the patient.  ?                          All questions were answered, and informed consent  ?                          was obtained. Prior Anticoagulants: The patient has  ?                          taken no previous anticoagulant or antiplatelet  ?                          agents. ASA Grade Assessment: II - A patient with  ?  mild systemic disease. After reviewing the risks  ?                          and benefits, the patient was deemed in  ?                          satisfactory condition to undergo the procedure. ?                          After obtaining informed consent, the endoscope was  ?                          passed under direct vision. Throughout the  ?                          procedure, the patient's  blood pressure, pulse, and  ?                          oxygen saturations were monitored continuously. The  ?                          [id number]-[id number] ([id number]) Olympus endoscope was introduced  ?                          through the mouth, and advanced to the second part  ?                          of duodenum. The [id number] ([id number]) Olympus  ?                          duodenoscope was introduced through the and  ?                          advanced to the. The upper GI endoscopy was  ?                          accomplished without difficulty. The patient  ?                          tolerated the procedure well. ?Scope In: ?Scope Out: ?Findings: ?     The upper third of the esophagus and middle third of the esophagus were  ?     normal. ?     There were esophageal mucosal changes suggestive of short-segment  ?     Barrett's esophagus present in the lower third of the esophagus. The  ?     maximum longitudinal extent of these mucosal changes was 1 cm in length.  ?     Mucosa was biopsied with a cold forceps for histology in a targeted  ?     manner in the lower third of the esophagus. One specimen bottle was sent  ?     to pathology. ?     The entire examined stomach was normal. ?     The cardia and gastric fundus were normal on retroflexion. ?     A previously placed plastic biliary stent  was seen in the ampulla. The  ?     distal end of the stent was behind a fold and it was difficult to grap  ?     it with a forward viewing gastroscope. ?     The adult gastroscope was withdrawn and a side viewing duodenoscope was  ?     used to better visualizes the stent. ?     Stent removal was accomplished with a snare. Minimal, self limited  ?     bleeding was noted with stent removal. ?Impression:               - Normal upper third of esophagus and middle third  ?                          of esophagus. ?                          - Esophageal mucosal changes suggestive of  ?                          short-segment Barrett's  esophagus. Biopsied. ?                          - Normal stomach. ?                          - Plastic biliary stent in the duodenum. Removed. ?Moderate Sedation: ?     Patient did not receive moderate sedation for this procedure, but  ?     instead received monitored anesthesia care. ?Recommendation:           - Patient has a contact number available for  ?                          emergencies. The signs and symptoms of potential  ?                          delayed complications were discussed with the  ?                          patient. Return to normal activities tomorrow.  ?                          Written discharge instructions were provided to the  ?                          patient. ?                          - Resume regular diet. ?                          - Continue present medications. ?Procedure Code(s):        --- Professional --- ?                          [phone], Esophagogastroduodenoscopy, flexible,  ?  transoral; with removal of foreign body(s) ?                          [id number], Esophagogastroduodenoscopy, flexible,  ?                          transoral; with biopsy, single or multiple ?Diagnosis Code(s):        --- Professional --- ?                          K22.8, Other specified diseases of esophagus ?                          Z46.59, Encounter for fitting and adjustment of  ?                          other gastrointestinal appliance and device ?CPT copyright 2019 [employer]. All rights reserved. ?The codes documented in this report are preliminary and upon coder review may  ?be revised to meet current compliance requirements. ?Ronnette Juniper, MD ?11/30/2021 9:14:56 AM ?This report has been signed electronically. ?Number of Addenda: 0 ?

## 2021-11-30 NOTE — Anesthesia Postprocedure Evaluation (Signed)
Anesthesia Post Note ? ?Patient: Amy Cowan ? ?Procedure(s) Performed: ESOPHAGOGASTRODUODENOSCOPY (EGD) WITH PROPOFOL ?GASTROINTESTINAL STENT REMOVAL ?BIOPSY ? ?  ? ?Patient location during evaluation: PACU ?Anesthesia Type: MAC ?Level of consciousness: awake and alert ?Pain management: pain level controlled ?Vital Signs Assessment: post-procedure vital signs reviewed and stable ?Respiratory status: spontaneous breathing, nonlabored ventilation, respiratory function stable and patient connected to nasal cannula oxygen ?Cardiovascular status: stable and blood pressure returned to baseline ?Postop Assessment: no apparent nausea or vomiting ?Anesthetic complications: no ? ? ?No notable events documented. ? ?Last Vitals:  ?Vitals:  ? 11/30/21 0912 11/30/21 0920  ?BP: 120/70 117/71  ?Pulse: 77 79  ?Resp: (!) 25 20  ?Temp: 36.7 ?C   ?SpO2: 100% 99%  ?  ?Last Pain:  ?Vitals:  ? 11/30/21 0920  ?TempSrc:   ?PainSc: 0-No pain  ? ? ?  ?  ?  ?  ?  ?  ? ?Suzette Battiest E ? ? ? ? ?

## 2021-11-30 NOTE — H&P (Signed)
Amy Cowan is an 61 y.o. female.   ?Chief Complaint: Plastic CBD stent, for EGD and removal ?HPI: 61 year old female underwent ERCP on 10/21/2021 for bile leak and had a plastic 10 French time 7 cm stent placed. ?Bile leak has resolved, right upper quadrant drain was removed about 3 weeks ago. ?Patient denies abdominal pain ? ?Past Medical History:  ?Diagnosis Date  ? Dyslipidemia   ? Hyperthyroidism   ? ? ?Past Surgical History:  ?Procedure Laterality Date  ? BILIARY STENT PLACEMENT N/A 10/21/2021  ? Procedure: BILIARY STENT PLACEMENT;  Surgeon: Ronnette Juniper, MD;  Location: [facility];  Service: Gastroenterology;  Laterality: N/A;  ? BUNIONECTOMY Right   ? ENDOSCOPIC RETROGRADE CHOLANGIOPANCREATOGRAPHY (ERCP) WITH PROPOFOL N/A 10/21/2021  ? Procedure: ENDOSCOPIC RETROGRADE CHOLANGIOPANCREATOGRAPHY (ERCP) WITH PROPOFOL;  Surgeon: Ronnette Juniper, MD;  Location: [facility];  Service: Gastroenterology;  Laterality: N/A;  ? REMOVAL OF STONES  10/21/2021  ? Procedure: REMOVAL OF SLUDGE;  Surgeon: Ronnette Juniper, MD;  Location: [facility];  Service: Gastroenterology;;  ? SPHINCTEROTOMY  10/21/2021  ? Procedure: SPHINCTEROTOMY;  Surgeon: Ronnette Juniper, MD;  Location: [facility];  Service: Gastroenterology;;  ? ? ?Family History  ?Problem Relation Age of Onset  ? Thyroid disease Son   ? Thyroid disease Mother   ? Lung cancer Mother   ? Thyroid disease Father   ? COPD Father   ? Diabetes Brother   ? ?Social History:  reports that she has been smoking. She has been smoking an average of 1 pack per day. She has never used smokeless tobacco. She reports current alcohol use. She reports that she does not use drugs. ? ?Allergies:  ?Allergies  ?Allergen Reactions  ? Atorvastatin Other (See Comments)  ?  Muscle ache  ? ? ?Medications Prior to Admission  ?Medication Sig Dispense Refill  ? calcium carbonate (TUMS EX) 750 MG chewable tablet Chew 1 tablet by mouth daily as needed for heartburn.    ? cholecalciferol (VITAMIN D3) 25 MCG  (1000 UNIT) tablet Take 1,000 Units by mouth daily.    ? ibuprofen (ADVIL) 200 MG tablet Take 400 mg by mouth every 8 (eight) hours as needed for moderate pain.    ? Lysine 1000 MG TABS Take 1,000 mg by mouth daily as needed (cold sores/fatigue).    ? polyethylene glycol (MIRALAX / GLYCOLAX) 17 g packet Take 17 g by mouth daily as needed for mild constipation. (Patient taking differently: Take 17 g by mouth daily as needed for moderate constipation.) 14 each 0  ? simvastatin (ZOCOR) 20 MG tablet Take 20 mg by mouth every evening.  3  ? vitamin C (ASCORBIC ACID) 500 MG tablet Take 500 mg by mouth daily.    ? zinc gluconate 50 MG tablet Take 50 mg by mouth daily.    ? acetaminophen (TYLENOL) 325 MG tablet Take 2 tablets (650 mg total) by mouth every 6 (six) hours as needed for mild pain (or temp > 100). (Patient not taking: Reported on 11/26/2021)    ? docusate sodium (COLACE) 100 MG capsule Take 1 capsule (100 mg total) by mouth every 12 (twelve) hours. (Patient not taking: Reported on 11/26/2021) 60 capsule 0  ? ondansetron (ZOFRAN-ODT) 4 MG disintegrating tablet Take 1 tablet (4 mg total) by mouth every 8 (eight) hours as needed for nausea or vomiting. (Patient not taking: Reported on 11/26/2021) 20 tablet 0  ? oxyCODONE (ROXICODONE) 5 MG immediate release tablet Take 1 tablet (5 mg total) by mouth every 6 (six)  hours as needed. (Patient not taking: Reported on 11/26/2021) 10 tablet 0  ? ? ?No results found for this or any previous visit (from the past 48 hour(s)). ?No results found. ? ?Review of Systems  ?Constitutional: Negative.   ?Respiratory: Negative.    ?Cardiovascular: Negative.   ?Gastrointestinal: Negative.   ?Neurological: Negative.   ? ?Blood pressure 114/66, pulse 79, temperature 97.7 ?F (36.5 ?C), temperature source Tympanic, resp. rate (!) 23, height '5\' 2"'$  (1.575 m), weight 81.2 kg, last menstrual period 07/10/2012, SpO2 97 %. ?Physical Exam ?Constitutional:   ?   Appearance: Normal appearance.  ?HENT:  ?    Head: Normocephalic and atraumatic.  ?   Mouth/Throat:  ?   Mouth: Mucous membranes are moist.  ?Eyes:  ?   Conjunctiva/sclera: Conjunctivae normal.  ?Cardiovascular:  ?   Rate and Rhythm: Normal rate and regular rhythm.  ?   Pulses: Normal pulses.  ?Pulmonary:  ?   Effort: Pulmonary effort is normal.  ?Abdominal:  ?   General: Abdomen is flat.  ?Neurological:  ?   General: No focal deficit present.  ?   Mental Status: She is alert and oriented to person, place, and time.  ?Psychiatric:     ?   Mood and Affect: Mood normal.     ?   Behavior: Behavior normal.  ?  ? ?Assessment/Plan ?Plastic CBD stent placed on 10/21/2021 for bile leak ?EGD for stent removal ?The risks and the benefits of the procedure were discussed with the patient in details. ?She understands and verbalizes consent. ? ?Ronnette Juniper, MD ?11/30/2021, 8:46 AM ? ? ? ?

## 2021-11-30 NOTE — Transfer of Care (Signed)
Immediate Anesthesia Transfer of Care Note ? ?Patient: Amy Cowan ? ?Procedure(s) Performed: Procedure(s): ?ESOPHAGOGASTRODUODENOSCOPY (EGD) WITH PROPOFOL (N/A) ?GASTROINTESTINAL STENT REMOVAL (N/A) ?BIOPSY ? ?Patient Location: PACU and Endoscopy Unit ? ?Anesthesia Type:MAC ? ?Level of Consciousness: awake, alert  and oriented ? ?Airway & Oxygen Therapy: Patient Spontanous Breathing and Patient connected to nasal cannula oxygen ? ?Post-op Assessment: Report given to RN and Post -op Vital signs reviewed and stable ? ?Post vital signs: Reviewed and stable ? ?Last Vitals:  ?Vitals:  ? 11/30/21 0829 11/30/21 0912  ?BP: 114/66 120/70  ?Pulse: 79 77  ?Resp: (!) 23 (!) 25  ?Temp: 36.5 ?C   ?SpO2: 97% 100%  ? ? ?Complications: No apparent anesthesia complications ? ?

## 2021-11-30 NOTE — Anesthesia Preprocedure Evaluation (Addendum)
Anesthesia Evaluation  ?Patient identified by MRN, date of birth, ID band ?Patient awake ? ? ? ?Reviewed: ?Allergy & Precautions, NPO status , Patient's Chart, lab work & pertinent test results ? ?Airway ?Mallampati: II ? ?TM Distance: >3 FB ?Neck ROM: Full ? ? ? Dental ? ?(+) Dental Advisory Given ?  ?Pulmonary ?Current Smoker and Patient abstained from smoking.,  ?  ?breath sounds clear to auscultation ? ? ? ? ? ? Cardiovascular ?negative cardio ROS ? ? ?Rhythm:Regular Rate:Normal ? ? ?  ?Neuro/Psych ?negative neurological ROS ?   ? GI/Hepatic ?negative GI ROS, Neg liver ROS,   ?Endo/Other  ?negative endocrine ROS ? Renal/GU ?negative Renal ROS  ? ?  ?Musculoskeletal ? ? Abdominal ?  ?Peds ? Hematology ?negative hematology ROS ?(+)   ?Anesthesia Other Findings ? ? Reproductive/Obstetrics ? ?  ? ? ? ? ? ? ? ? ? ? ? ? ? ?  ?  ? ? ? ? ? ? ? ?Anesthesia Physical ?Anesthesia Plan ? ?ASA: 2 ? ?Anesthesia Plan: MAC  ? ?Post-op Pain Management: Minimal or no pain anticipated  ? ?Induction:  ? ?PONV Risk Score and Plan: 1 and Propofol infusion and Treatment may vary due to age or medical condition ? ?Airway Management Planned: Natural Airway and Nasal Cannula ? ?Additional Equipment:  ? ?Intra-op Plan:  ? ?Post-operative Plan:  ? ?Informed Consent: I have reviewed the patients History and Physical, chart, labs and discussed the procedure including the risks, benefits and alternatives for the proposed anesthesia with the patient or authorized representative who has indicated his/her understanding and acceptance.  ? ? ? ? ? ?Plan Discussed with:  ? ?Anesthesia Plan Comments:   ? ? ? ? ? ? ?Anesthesia Quick Evaluation ? ?

## 2021-11-30 NOTE — Discharge Instructions (Signed)
YOU HAD AN ENDOSCOPIC PROCEDURE TODAY: Refer to the procedure report and other information in the discharge instructions given to you for any specific questions about what was found during the examination. If this information does not answer your questions, please call [place] office at [phone] to clarify.   YOU SHOULD EXPECT: Some feelings of bloating in the abdomen. Passage of more gas than usual. Walking can help get rid of the air that was put into your GI tract during the procedure and reduce the bloating. If you had a lower endoscopy (such as a colonoscopy or flexible sigmoidoscopy) you may notice spotting of blood in your stool or on the toilet paper. Some abdominal soreness may be present for a day or two, also.  DIET: Your first meal following the procedure should be a light meal and then it is ok to progress to your normal diet. A half-sandwich or bowl of soup is an example of a good first meal. Heavy or fried foods are harder to digest and may make you feel nauseous or bloated. Drink plenty of fluids but you should avoid alcoholic beverages for 24 hours. If you had a esophageal dilation, please see attached instructions for diet.    ACTIVITY: Your care partner should take you home directly after the procedure. You should plan to take it easy, moving slowly for the rest of the day. You can resume normal activity the day after the procedure however YOU SHOULD NOT DRIVE, use power tools, machinery or perform tasks that involve climbing or major physical exertion for 24 hours (because of the sedation medicines used during the test).   SYMPTOMS TO REPORT IMMEDIATELY: A gastroenterologist can be reached at any hour. Please call [phone]  for any of the following symptoms:   Following upper endoscopy (EGD, EUS, ERCP, esophageal dilation) Vomiting of blood or coffee ground material  New, significant abdominal pain  New, significant chest pain or pain under the shoulder blades  Painful or  persistently difficult swallowing  New shortness of breath  Black, tarry-looking or red, bloody stools  FOLLOW UP:  If any biopsies were taken you will be contacted by phone or by letter within the next 1-3 weeks. Call [phone]  if you have not heard about the biopsies in 3 weeks.  Please also call with any specific questions about appointments or follow up tests.   

## 2021-12-03 ENCOUNTER — Encounter (HOSPITAL_COMMUNITY): Payer: Self-pay | Admitting: Gastroenterology

## 2021-12-03 LAB — SURGICAL PATHOLOGY

## 2022-01-09 ENCOUNTER — Telehealth: Payer: Self-pay

## 2022-01-09 NOTE — Telephone Encounter (Signed)
Left VM message for patient to call to schedule yearly LCDT ?

## 2022-01-10 ENCOUNTER — Other Ambulatory Visit: Payer: Self-pay

## 2022-01-10 DIAGNOSIS — Z122 Encounter for screening for malignant neoplasm of respiratory organs: Secondary | ICD-10-CM

## 2022-01-10 DIAGNOSIS — E0590 Thyrotoxicosis, unspecified without thyrotoxic crisis or storm: Secondary | ICD-10-CM | POA: Diagnosis not present

## 2022-01-10 DIAGNOSIS — Z87891 Personal history of nicotine dependence: Secondary | ICD-10-CM

## 2022-01-10 DIAGNOSIS — F17210 Nicotine dependence, cigarettes, uncomplicated: Secondary | ICD-10-CM

## 2022-01-29 ENCOUNTER — Ambulatory Visit
Admission: RE | Admit: 2022-01-29 | Discharge: 2022-01-29 | Disposition: A | Discharge status: Home / Self Care | Payer: BC Managed Care – PPO | Source: Ambulatory Visit | Attending: Acute Care | Admitting: Acute Care

## 2022-01-29 DIAGNOSIS — F17210 Nicotine dependence, cigarettes, uncomplicated: Secondary | ICD-10-CM

## 2022-01-29 DIAGNOSIS — Z122 Encounter for screening for malignant neoplasm of respiratory organs: Secondary | ICD-10-CM

## 2022-01-29 DIAGNOSIS — Z87891 Personal history of nicotine dependence: Secondary | ICD-10-CM

## 2022-02-01 ENCOUNTER — Other Ambulatory Visit: Payer: Self-pay | Admitting: Acute Care

## 2022-02-01 DIAGNOSIS — F17210 Nicotine dependence, cigarettes, uncomplicated: Secondary | ICD-10-CM

## 2022-02-01 DIAGNOSIS — Z122 Encounter for screening for malignant neoplasm of respiratory organs: Secondary | ICD-10-CM

## 2022-02-01 DIAGNOSIS — Z87891 Personal history of nicotine dependence: Secondary | ICD-10-CM

## 2022-02-16 IMAGING — RF DG ERCP WO/W SPHINCTEROTOMY
1 series · 9 of 9 positions shown · non-contrast
Comparison: NM HIDA 10/16/2021.

CLINICAL DATA: Post cholecystectomy.  Bile leak.

EXAM:
ERCP

[Series 1: run · 9 of 9 slices shown]
[im 1/9]
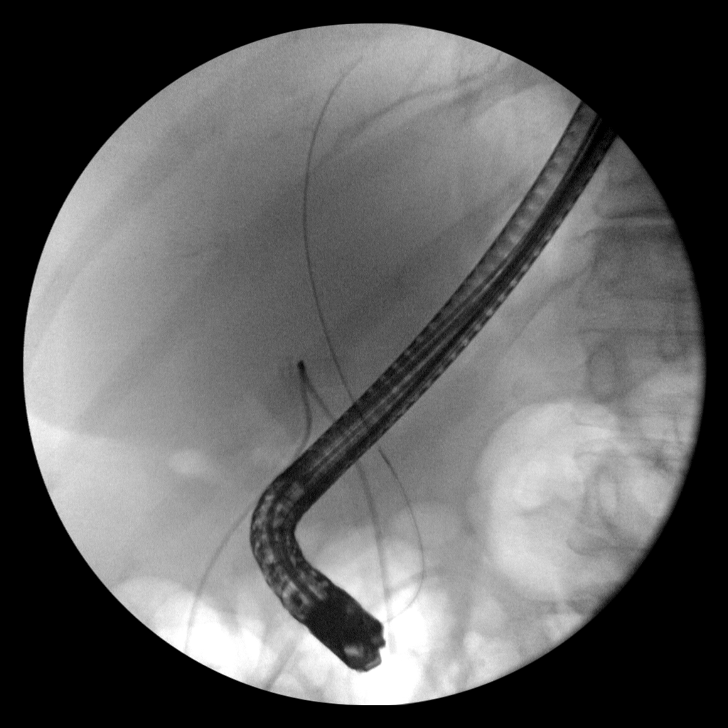
[im 2/9]
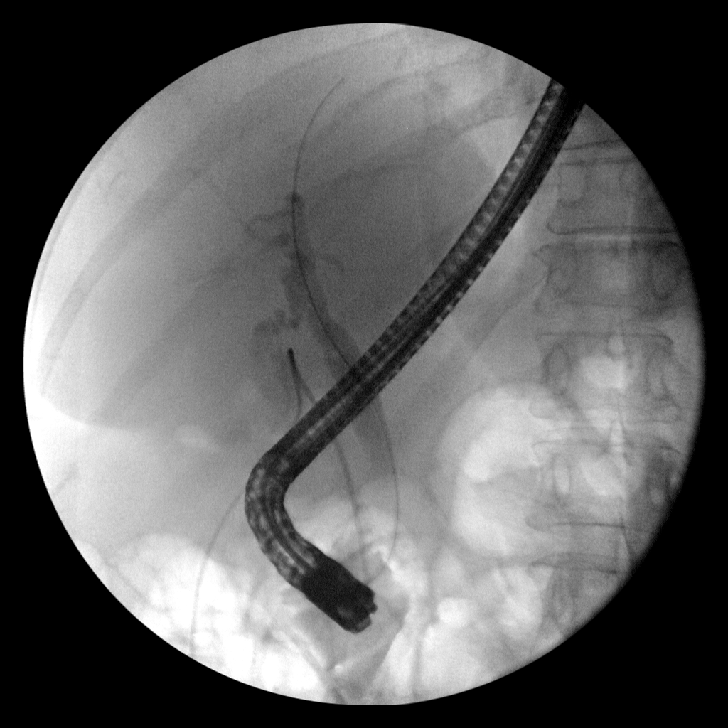
[im 3/9]
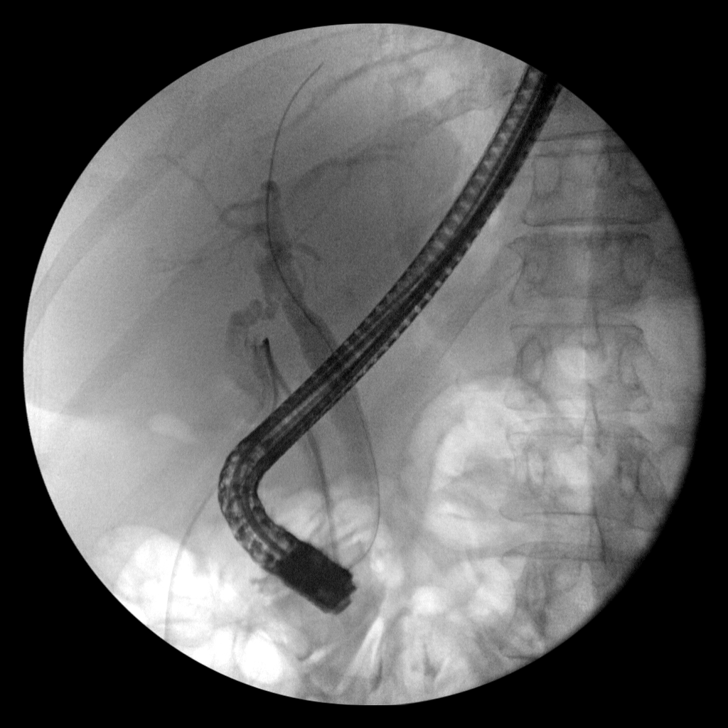
[im 4/9]
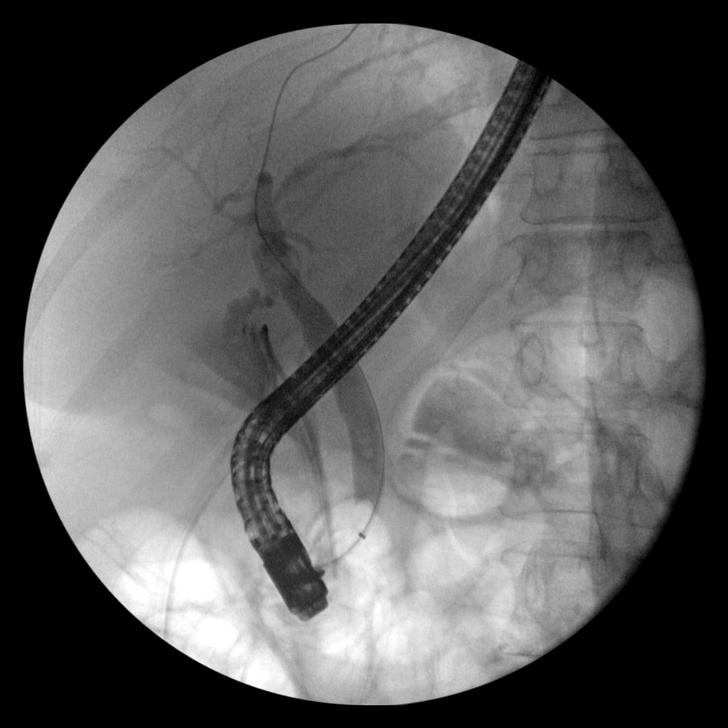
[im 5/9]
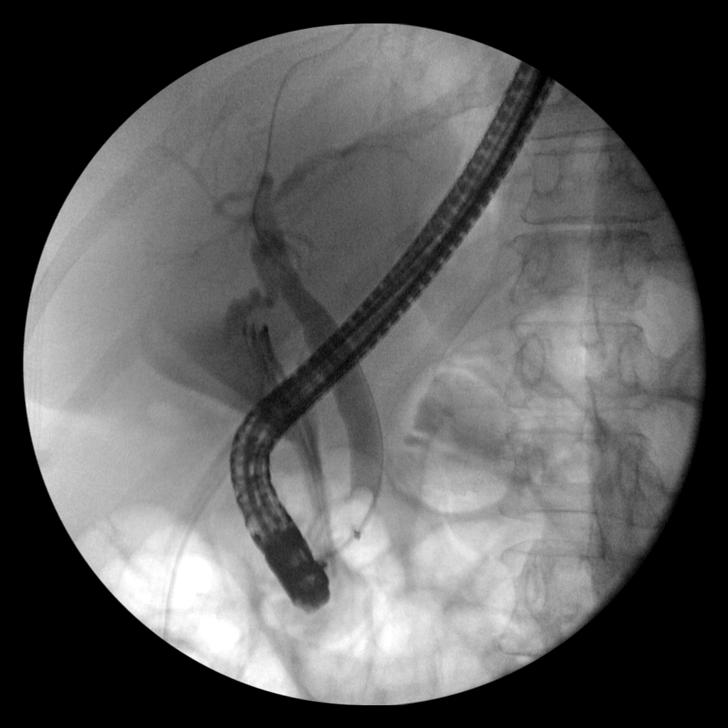
[im 6/9]
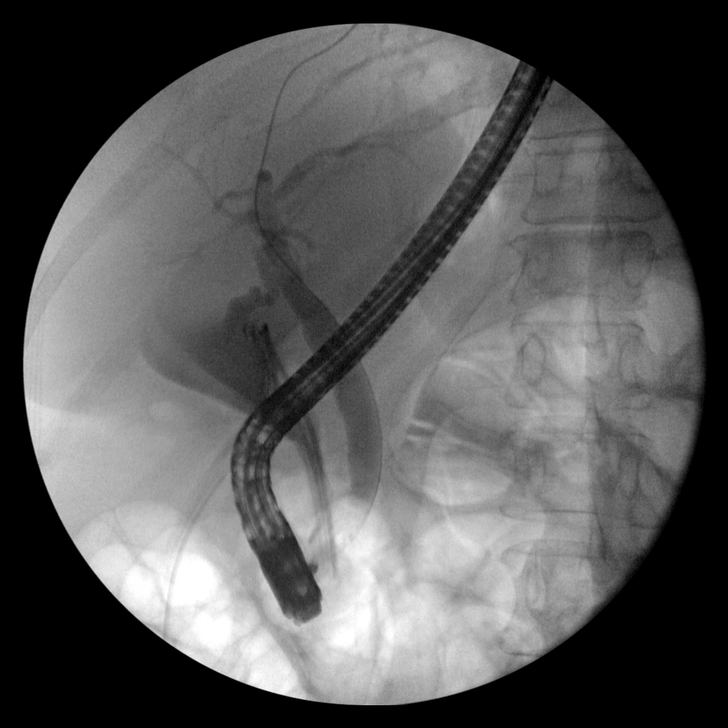
[im 7/9]
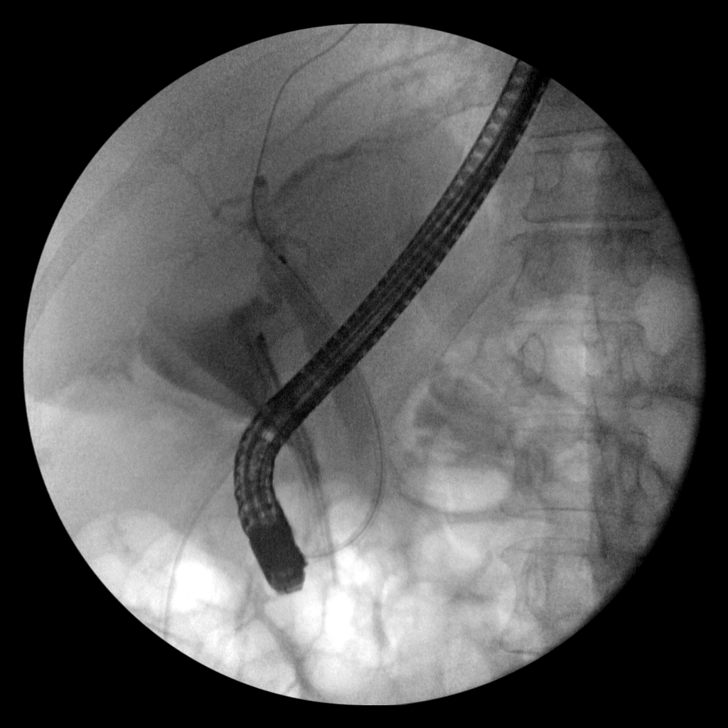
[im 8/9]
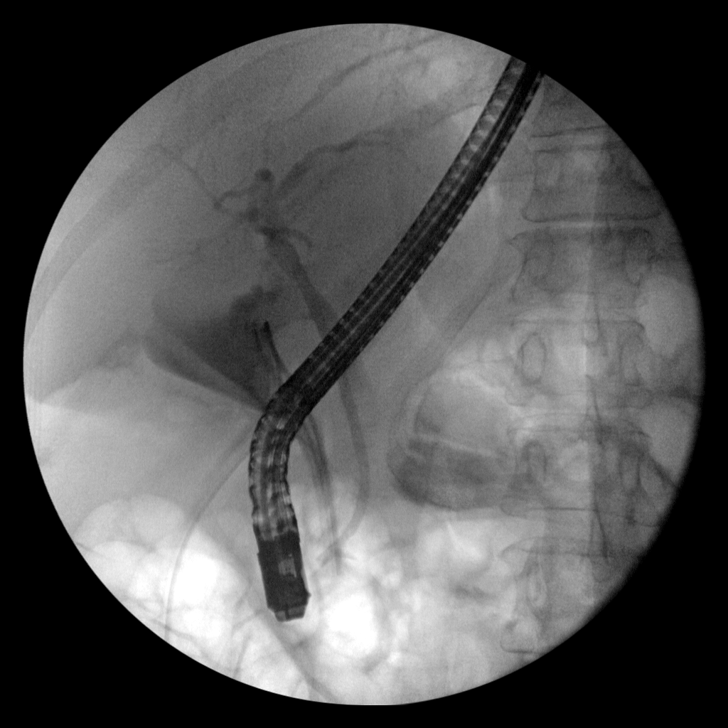
[im 9/9]
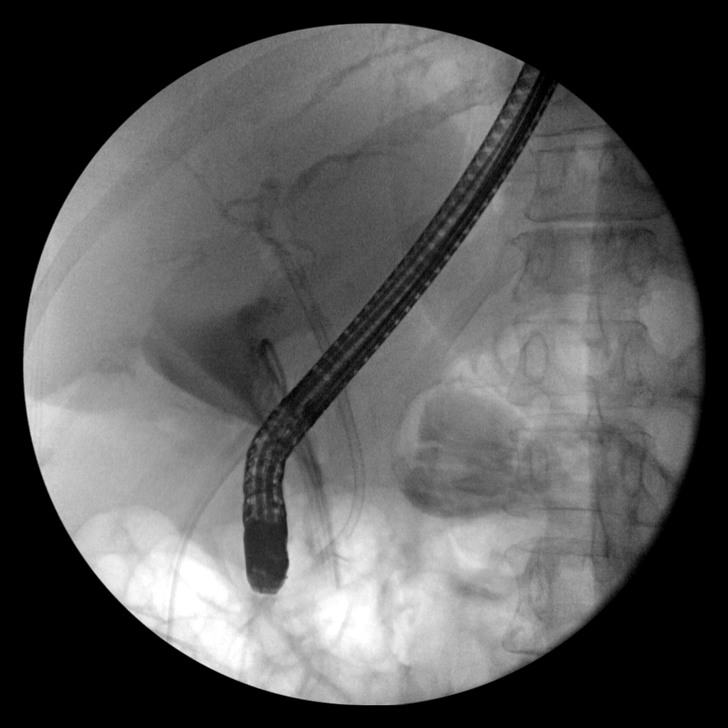

[9 of 9 positions shown; findings below may reference images not displayed]

CT AP, 10/13/2021.

FLUOROSCOPY TIME:  Fluoroscopy Time:  1 minute 43 seconds

Radiation Exposure Index (if provided by the fluoroscopic device):
24.1 mGy

Number of Acquired Spot Images: 1 fluoroscopic cine loop, with 10
images.
FINDINGS: Multiple, limited oblique planar images of the RIGHT upper quadrant
obtained C-arm.

Images demonstrating flexible endoscopy, biliary duct cannulation,
and retrograde cholangiogram.

A RIGHT upper quadrant drain is present, and patent cystic duct with
bile leak into the gallbladder fossa. See key image.
IMPRESSION: Fluoroscopic imaging for ERCP. Patent cystic duct with exam positive
for bile leak into the gallbladder fossa.

For complete description of intra procedural findings, please see
performing service dictation.

These results will be called to the ordering clinician or
representative by the Radiologist Assistant, and communication
documented in the PACS or [REDACTED].

## 2022-02-21 DIAGNOSIS — E0590 Thyrotoxicosis, unspecified without thyrotoxic crisis or storm: Secondary | ICD-10-CM | POA: Diagnosis not present

## 2022-02-28 DIAGNOSIS — E0590 Thyrotoxicosis, unspecified without thyrotoxic crisis or storm: Secondary | ICD-10-CM | POA: Diagnosis not present

## 2022-03-13 DIAGNOSIS — J0110 Acute frontal sinusitis, unspecified: Secondary | ICD-10-CM | POA: Diagnosis not present

## 2022-03-13 DIAGNOSIS — F17200 Nicotine dependence, unspecified, uncomplicated: Secondary | ICD-10-CM | POA: Diagnosis not present

## 2022-03-13 DIAGNOSIS — H6593 Unspecified nonsuppurative otitis media, bilateral: Secondary | ICD-10-CM | POA: Diagnosis not present

## 2022-05-28 DIAGNOSIS — E0590 Thyrotoxicosis, unspecified without thyrotoxic crisis or storm: Secondary | ICD-10-CM | POA: Diagnosis not present

## 2022-06-04 DIAGNOSIS — E0590 Thyrotoxicosis, unspecified without thyrotoxic crisis or storm: Secondary | ICD-10-CM | POA: Diagnosis not present

## 2022-06-21 ENCOUNTER — Other Ambulatory Visit: Payer: Self-pay | Admitting: Home Modifications

## 2022-06-21 ENCOUNTER — Other Ambulatory Visit (HOSPITAL_COMMUNITY): Payer: Self-pay | Admitting: Home Modifications

## 2022-06-21 DIAGNOSIS — E0590 Thyrotoxicosis, unspecified without thyrotoxic crisis or storm: Secondary | ICD-10-CM

## 2022-07-09 ENCOUNTER — Other Ambulatory Visit (HOSPITAL_COMMUNITY): Payer: BC Managed Care – PPO

## 2022-07-09 ENCOUNTER — Encounter (HOSPITAL_COMMUNITY): Payer: Self-pay

## 2022-07-10 ENCOUNTER — Other Ambulatory Visit (HOSPITAL_COMMUNITY): Payer: BC Managed Care – PPO

## 2022-07-15 ENCOUNTER — Encounter (HOSPITAL_COMMUNITY): Payer: BC Managed Care – PPO

## 2022-07-15 ENCOUNTER — Encounter (HOSPITAL_COMMUNITY)
Admission: RE | Admit: 2022-07-15 | Discharge: 2022-07-15 | Disposition: A | Discharge status: Home / Self Care | Payer: BC Managed Care – PPO | Source: Ambulatory Visit | Attending: Home Modifications | Admitting: Home Modifications

## 2022-07-15 DIAGNOSIS — E0590 Thyrotoxicosis, unspecified without thyrotoxic crisis or storm: Secondary | ICD-10-CM | POA: Insufficient documentation

## 2022-07-16 ENCOUNTER — Encounter (HOSPITAL_COMMUNITY): Payer: BC Managed Care – PPO

## 2022-07-18 ENCOUNTER — Encounter (HOSPITAL_COMMUNITY)
Admission: RE | Admit: 2022-07-18 | Discharge: 2022-07-18 | Disposition: A | Discharge status: Home / Self Care | Payer: BC Managed Care – PPO | Source: Ambulatory Visit | Attending: Home Modifications | Admitting: Home Modifications

## 2022-07-18 DIAGNOSIS — E0590 Thyrotoxicosis, unspecified without thyrotoxic crisis or storm: Secondary | ICD-10-CM | POA: Diagnosis not present

## 2022-07-18 MED ORDER — SODIUM IODIDE I-123 7.4 MBQ CAPS
402.6000 | ORAL_CAPSULE | Freq: Once | ORAL | Status: AC
  Administered 2022-07-18: 402.6 via ORAL

## 2022-07-19 ENCOUNTER — Encounter (HOSPITAL_COMMUNITY)
Admission: RE | Admit: 2022-07-19 | Discharge: 2022-07-19 | Disposition: A | Discharge status: Home / Self Care | Payer: BC Managed Care – PPO | Source: Ambulatory Visit | Attending: Home Modifications | Admitting: Home Modifications

## 2022-07-25 DIAGNOSIS — Z1211 Encounter for screening for malignant neoplasm of colon: Secondary | ICD-10-CM | POA: Diagnosis not present

## 2022-07-25 DIAGNOSIS — G479 Sleep disorder, unspecified: Secondary | ICD-10-CM | POA: Diagnosis not present

## 2022-07-25 DIAGNOSIS — K648 Other hemorrhoids: Secondary | ICD-10-CM | POA: Diagnosis not present

## 2022-07-25 DIAGNOSIS — D124 Benign neoplasm of descending colon: Secondary | ICD-10-CM | POA: Diagnosis not present

## 2022-07-25 DIAGNOSIS — D123 Benign neoplasm of transverse colon: Secondary | ICD-10-CM | POA: Diagnosis not present

## 2022-07-25 DIAGNOSIS — R635 Abnormal weight gain: Secondary | ICD-10-CM | POA: Diagnosis not present

## 2022-07-25 MED ORDER — SODIUM IODIDE I-123 7.4 MBQ CAPS: 402.6000 | ORAL_CAPSULE | Freq: Once | ORAL | Status: AC

## 2022-09-03 DIAGNOSIS — E0590 Thyrotoxicosis, unspecified without thyrotoxic crisis or storm: Secondary | ICD-10-CM | POA: Diagnosis not present

## 2022-09-30 DIAGNOSIS — H2513 Age-related nuclear cataract, bilateral: Secondary | ICD-10-CM | POA: Diagnosis not present

## 2022-09-30 DIAGNOSIS — H5213 Myopia, bilateral: Secondary | ICD-10-CM | POA: Diagnosis not present

## 2022-12-03 DIAGNOSIS — E0590 Thyrotoxicosis, unspecified without thyrotoxic crisis or storm: Secondary | ICD-10-CM | POA: Diagnosis not present

## 2022-12-11 DIAGNOSIS — E0590 Thyrotoxicosis, unspecified without thyrotoxic crisis or storm: Secondary | ICD-10-CM | POA: Diagnosis not present

## 2022-12-11 DIAGNOSIS — E559 Vitamin D deficiency, unspecified: Secondary | ICD-10-CM | POA: Diagnosis not present

## 2022-12-11 DIAGNOSIS — R238 Other skin changes: Secondary | ICD-10-CM | POA: Diagnosis not present

## 2022-12-11 DIAGNOSIS — E7800 Pure hypercholesterolemia, unspecified: Secondary | ICD-10-CM | POA: Diagnosis not present

## 2022-12-11 DIAGNOSIS — Z0000 Encounter for general adult medical examination without abnormal findings: Secondary | ICD-10-CM | POA: Diagnosis not present

## 2022-12-11 DIAGNOSIS — R7303 Prediabetes: Secondary | ICD-10-CM | POA: Diagnosis not present

## 2022-12-13 ENCOUNTER — Other Ambulatory Visit: Payer: Self-pay

## 2022-12-13 DIAGNOSIS — Z87891 Personal history of nicotine dependence: Secondary | ICD-10-CM

## 2022-12-13 DIAGNOSIS — F17210 Nicotine dependence, cigarettes, uncomplicated: Secondary | ICD-10-CM

## 2022-12-30 DIAGNOSIS — Z01419 Encounter for gynecological examination (general) (routine) without abnormal findings: Secondary | ICD-10-CM | POA: Diagnosis not present

## 2022-12-30 DIAGNOSIS — Z1389 Encounter for screening for other disorder: Secondary | ICD-10-CM | POA: Diagnosis not present

## 2022-12-30 DIAGNOSIS — Z1231 Encounter for screening mammogram for malignant neoplasm of breast: Secondary | ICD-10-CM | POA: Diagnosis not present

## 2022-12-30 DIAGNOSIS — Z130 Encounter for screening for diseases of the blood and blood-forming organs and certain disorders involving the immune mechanism: Secondary | ICD-10-CM | POA: Diagnosis not present

## 2023-02-03 ENCOUNTER — Ambulatory Visit
Admission: RE | Admit: 2023-02-03 | Discharge: 2023-02-03 | Disposition: A | Discharge status: Home / Self Care | Payer: BC Managed Care – PPO | Source: Ambulatory Visit | Attending: Acute Care | Admitting: Acute Care

## 2023-02-03 DIAGNOSIS — Z87891 Personal history of nicotine dependence: Secondary | ICD-10-CM

## 2023-02-03 DIAGNOSIS — F17210 Nicotine dependence, cigarettes, uncomplicated: Secondary | ICD-10-CM

## 2023-02-03 DIAGNOSIS — Z122 Encounter for screening for malignant neoplasm of respiratory organs: Secondary | ICD-10-CM | POA: Diagnosis not present

## 2023-02-03 DIAGNOSIS — J439 Emphysema, unspecified: Secondary | ICD-10-CM | POA: Diagnosis not present

## 2023-02-05 DIAGNOSIS — R208 Other disturbances of skin sensation: Secondary | ICD-10-CM | POA: Diagnosis not present

## 2023-02-05 DIAGNOSIS — R238 Other skin changes: Secondary | ICD-10-CM | POA: Diagnosis not present

## 2023-02-06 ENCOUNTER — Encounter: Payer: Self-pay | Admitting: Neurology

## 2023-02-07 ENCOUNTER — Telehealth: Payer: Self-pay | Admitting: Acute Care

## 2023-02-07 NOTE — Telephone Encounter (Signed)
Call report going to SG.

## 2023-02-07 NOTE — Telephone Encounter (Signed)
  IMPRESSION: 1. New aggressive appearing right upper lobe pulmonary nodule, as above, categorized as Lung-RADS 4B, suspicious. Additional imaging evaluation or consultation with Pulmonology or Thoracic Surgery recommended. 2. Mild diffuse bronchial wall thickening with very mild centrilobular and paraseptal emphysema; imaging findings suggestive of underlying COPD.

## 2023-02-07 NOTE — Telephone Encounter (Signed)
Call report. Please call [facility] @ [phone]

## 2023-02-12 ENCOUNTER — Ambulatory Visit: Payer: BC Managed Care – PPO | Admitting: Emergency Medicine

## 2023-02-12 ENCOUNTER — Encounter: Payer: Self-pay | Admitting: Emergency Medicine

## 2023-02-12 VITALS — BP 138/74 | HR 81 | Temp 98.1°F | Ht 62.0 in | Wt 194.4 lb

## 2023-02-12 DIAGNOSIS — Z720 Tobacco use: Secondary | ICD-10-CM | POA: Insufficient documentation

## 2023-02-12 DIAGNOSIS — R911 Solitary pulmonary nodule: Secondary | ICD-10-CM

## 2023-02-12 NOTE — Patient Instructions (Addendum)
We reviewed your CT scan of the chest today. We will arrange for a PET scan We will arrange for full pulmonary function testing. Please follow with Dr. Delton Coombes next available after these tests are performed so we can review them together.  Based on this we will plan next steps in evaluation.

## 2023-02-12 NOTE — Progress Notes (Signed)
Subjective:    Patient ID: Amy Cowan, female    DOB: 06-Aug-1961, 62 y.o.   MRN: [id number]  HPI 62 year old woman with a history of tobacco use (25-30 pack years), hyperlipidemia, hypothyroidism, GERD.  Also with a history of a cholecystectomy 09/2021 with a bile leak that required ERCP with stent placement.  She is referred today for evaluation of an abnormal CT scan of the chest.  She participates in lung cancer screening program and her most recent scan was done on 02/03/2023 as below. Today she reports that her breathing is ok. She has had episodes of bronchitis and has been on albuterol in the past - not recently. Occasional cough. She does have GERD  Lung cancer screening CT chest 02/03/2023 reviewed by me, shows no mediastinal or hilar adenopathy, 19 mm new right upper lobe pulmonary nodule with some peripheral groundglass attenuation, not present on 01/29/2022  Review of Systems As per HPI  Past Medical History:  Diagnosis Date   Dyslipidemia    Hyperthyroidism      Family History  Problem Relation Age of Onset   Thyroid disease Son    Thyroid disease Mother    Lung cancer Mother    Thyroid disease Father    COPD Father    Diabetes Brother     Mother w lung CA.   Social History   Socioeconomic History   Marital status: Married    Spouse name: Not on file   Number of children: Not on file   Years of education: Not on file   Highest education level: Not on file  Occupational History   Not on file  Tobacco Use   Smoking status: Heavy Smoker    Packs/day: 1    Types: Cigarettes   Smokeless tobacco: Never   Tobacco comments:    Smokes 1 cigarette every couple of days [facility] 02/12/23  Substance and Sexual Activity   Alcohol use: Yes    Comment: occ   Drug use: No   Sexual activity: Not on file  Other Topics Concern   Not on file  Social History Narrative   Not on file   Social Determinants of Health   Financial Resource Strain: Not on file  Food  Insecurity: Not on file  Transportation Needs: Not on file  Physical Activity: Not on file  Stress: Not on file  Social Connections: Not on file  Intimate Partner Violence: Not on file    She is active. 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   Allergies  Allergen Reactions   Atorvastatin Other (See Comments)    Muscle ache     Outpatient Medications Prior to Visit  Medication Sig Dispense Refill   cholecalciferol (VITAMIN D3) 25 MCG (1000 UNIT) tablet Take 1,000 Units by mouth daily.     vitamin C (ASCORBIC ACID) 500 MG tablet Take 500 mg by mouth daily.     acetaminophen (TYLENOL) 325 MG tablet Take 2 tablets (650 mg total) by mouth every 6 (six) hours as needed for mild pain (or temp > 100). (Patient not taking: Reported on 02/12/2023)     calcium carbonate (TUMS EX) 750 MG chewable tablet Chew 1 tablet by mouth daily as needed for heartburn. (Patient not taking: Reported on 02/12/2023)     docusate sodium (COLACE) 100 MG capsule Take 1 capsule (100 mg total) by mouth every 12 (twelve) hours. (Patient not taking: Reported on 02/12/2023) 60 capsule 0  ibuprofen (ADVIL) 200 MG tablet Take 400 mg by mouth every 8 (eight) hours as needed for moderate pain. (Patient not taking: Reported on 02/12/2023)     Lysine 1000 MG TABS Take 1,000 mg by mouth daily as needed (cold sores/fatigue). (Patient not taking: Reported on 02/12/2023)     ondansetron (ZOFRAN-ODT) 4 MG disintegrating tablet Take 1 tablet (4 mg total) by mouth every 8 (eight) hours as needed for nausea or vomiting. (Patient not taking: Reported on 02/12/2023) 20 tablet 0   oxyCODONE (ROXICODONE) 5 MG immediate release tablet Take 1 tablet (5 mg total) by mouth every 6 (six) hours as needed. (Patient not taking: Reported on 02/12/2023) 10 tablet 0   polyethylene glycol (MIRALAX / GLYCOLAX) 17 g packet Take 17 g by mouth daily as needed for mild constipation. (Patient not taking: Reported on  02/12/2023) 14 each 0   simvastatin (ZOCOR) 20 MG tablet Take 20 mg by mouth every evening. (Patient not taking: Reported on 02/12/2023)  3   zinc gluconate 50 MG tablet Take 50 mg by mouth daily. (Patient not taking: Reported on 02/12/2023)     Facility-Administered Medications Prior to Visit  Medication Dose Route Frequency Provider Last Rate Last Admin   sodium iodide (I-123) capsule 402.6 microcurie  402.6 microcurie Oral Once Jule Economy, MD              Objective:   Physical Exam  Vitals:   02/12/23 1508  BP: 138/74  Pulse: 81  Temp: 98.1 F (36.7 C)  TempSrc: Oral  SpO2: 97%  Weight: 194 lb 6.4 oz (88.2 kg)  Height: 5\' 2"  (1.575 m)    Gen: Pleasant, well-nourished, in no distress,  normal affect  ENT: No lesions,  mouth clear,  oropharynx clear, no postnasal drip  Neck: No JVD, no stridor  Lungs: No use of accessory muscles, no crackles or wheezing on normal respiration, no wheeze on forced expiration  Cardiovascular: RRR, heart sounds normal, no murmur or gallops, no peripheral edema  Musculoskeletal: No deformities, no cyanosis or clubbing  Neuro: alert, awake, non focal  Skin: Warm, no lesions or rash     Assessment & Plan:   Pulmonary nodule New suspicious pulmonary nodule noted on her screening CT scan of the chest.  19 mm in the right upper lobe.  She has a tobacco history, family history of lung cancer.  Moderate to high suspicion that this is an early malignancy.  Talked to her today about the possible plans for evaluation including referral for primary resection by thoracic surgery versus navigational bronchoscopy.  I have recommended PET scan, PFT now so we can better characterize the nodule, look for any distant disease and determine whether thoracic surgery referral is realistic based on her lung function.  If we deem thoracic surgery referral is not warranted then I would push for robotic navigational bronchoscopy for biopsies.  Tobacco  use Smoking much less than her previous baseline.  Needs to work on cessation and plan to stop it altogether.  We will try to help her with this.   Levy Pupa, MD, PhD 02/12/2023, 3:40 PM [facility] [phone] or if no answer before 7:00PM call [phone] For any issues after 7:00PM please call eLink [phone]

## 2023-02-12 NOTE — Assessment & Plan Note (Signed)
Smoking much less than her previous baseline.  Needs to work on cessation and plan to stop it altogether.  We will try to help her with this.

## 2023-02-12 NOTE — Assessment & Plan Note (Signed)
New suspicious pulmonary nodule noted on her screening CT scan of the chest.  19 mm in the right upper lobe.  She has a tobacco history, family history of lung cancer.  Moderate to high suspicion that this is an early malignancy.  Talked to her today about the possible plans for evaluation including referral for primary resection by thoracic surgery versus navigational bronchoscopy.  I have recommended PET scan, PFT now so we can better characterize the nodule, look for any distant disease and determine whether thoracic surgery referral is realistic based on her lung function.  If we deem thoracic surgery referral is not warranted then I would push for robotic navigational bronchoscopy for biopsies.

## 2023-02-13 ENCOUNTER — Telehealth: Payer: Self-pay | Admitting: Emergency Medicine

## 2023-02-13 NOTE — Telephone Encounter (Signed)
Opened in error

## 2023-02-24 ENCOUNTER — Ambulatory Visit (HOSPITAL_COMMUNITY)
Admission: RE | Admit: 2023-02-24 | Discharge: 2023-02-24 | Disposition: A | Discharge status: Home / Self Care | Payer: BC Managed Care – PPO | Source: Ambulatory Visit | Attending: Emergency Medicine | Admitting: Emergency Medicine

## 2023-02-24 DIAGNOSIS — R911 Solitary pulmonary nodule: Secondary | ICD-10-CM

## 2023-02-24 LAB — PULMONARY FUNCTION TEST
DL/VA % pred: 102 %
DL/VA: 4.39 ml/min/mmHg/L
DLCO unc % pred: 85 %
DLCO unc: 16.09 ml/min/mmHg
FEF 25-75 Post: 2.63 L/sec
FEF 25-75 Pre: 2.36 L/sec
FEF2575-%Change-Post: 11 %
FEF2575-%Pred-Post: 122 %
FEF2575-%Pred-Pre: 109 %
FEV1-%Change-Post: 4 %
FEV1-%Pred-Post: 100 %
FEV1-%Pred-Pre: 96 %
FEV1-Post: 2.33 L
FEV1-Pre: 2.24 L
FEV1FVC-%Change-Post: 2 %
FEV1FVC-%Pred-Pre: 107 %
FEV6-%Change-Post: 1 %
FEV6-%Pred-Post: 93 %
FEV6-%Pred-Pre: 92 %
FEV6-Post: 2.72 L
FEV6-Pre: 2.69 L
FEV6FVC-%Pred-Post: 103 %
FEV6FVC-%Pred-Pre: 103 %
FVC-%Change-Post: 1 %
FVC-%Pred-Post: 90 %
FVC-%Pred-Pre: 89 %
FVC-Post: 2.72 L
FVC-Pre: 2.69 L
Post FEV1/FVC ratio: 86 %
Post FEV6/FVC ratio: 100 %
Pre FEV1/FVC ratio: 83 %
Pre FEV6/FVC Ratio: 100 %
RV % pred: 114 %
RV: 2.2 L
TLC % pred: 102 %
TLC: 4.89 L

## 2023-02-24 MED ORDER — ALBUTEROL SULFATE (2.5 MG/3ML) 0.083% IN NEBU
2.5000 mg | INHALATION_SOLUTION | Freq: Once | RESPIRATORY_TRACT | Status: AC
  Administered 2023-02-24: 2.5 mg via RESPIRATORY_TRACT

## 2023-02-26 DIAGNOSIS — E0590 Thyrotoxicosis, unspecified without thyrotoxic crisis or storm: Secondary | ICD-10-CM | POA: Diagnosis not present

## 2023-03-03 ENCOUNTER — Encounter (HOSPITAL_COMMUNITY)
Admission: RE | Admit: 2023-03-03 | Discharge: 2023-03-03 | Disposition: A | Discharge status: Home / Self Care | Payer: BC Managed Care – PPO | Source: Ambulatory Visit | Attending: Emergency Medicine | Admitting: Emergency Medicine

## 2023-03-03 DIAGNOSIS — R911 Solitary pulmonary nodule: Secondary | ICD-10-CM | POA: Diagnosis not present

## 2023-03-03 LAB — GLUCOSE, CAPILLARY: Glucose-Capillary: 104 mg/dL — ABNORMAL HIGH (ref 70–99)

## 2023-03-03 MED ORDER — FLUDEOXYGLUCOSE F - 18 (FDG) INJECTION
10.0000 | Freq: Once | INTRAVENOUS | Status: AC | PRN
  Administered 2023-03-03: 9.72 via INTRAVENOUS

## 2023-03-05 DIAGNOSIS — E0590 Thyrotoxicosis, unspecified without thyrotoxic crisis or storm: Secondary | ICD-10-CM | POA: Diagnosis not present

## 2023-03-07 ENCOUNTER — Other Ambulatory Visit: Payer: Self-pay | Admitting: *Deleted

## 2023-03-07 ENCOUNTER — Ambulatory Visit: Payer: BC Managed Care – PPO | Admitting: Emergency Medicine

## 2023-03-07 ENCOUNTER — Encounter: Payer: Self-pay | Admitting: Emergency Medicine

## 2023-03-07 VITALS — BP 126/74 | HR 85 | Temp 98.2°F | Ht 62.0 in | Wt 197.8 lb

## 2023-03-07 DIAGNOSIS — Z122 Encounter for screening for malignant neoplasm of respiratory organs: Secondary | ICD-10-CM

## 2023-03-07 DIAGNOSIS — Z720 Tobacco use: Secondary | ICD-10-CM

## 2023-03-07 DIAGNOSIS — F17210 Nicotine dependence, cigarettes, uncomplicated: Secondary | ICD-10-CM

## 2023-03-07 DIAGNOSIS — Z87891 Personal history of nicotine dependence: Secondary | ICD-10-CM

## 2023-03-07 DIAGNOSIS — R911 Solitary pulmonary nodule: Secondary | ICD-10-CM

## 2023-03-07 NOTE — Progress Notes (Signed)
Subjective:    Patient ID: Amy Cowan, female    DOB: 1961-09-17, 62 y.o.   MRN: [id number]  HPI 62 year old woman with a history of tobacco use (25-30 pack years), hyperlipidemia, hypothyroidism, GERD.  Also with a history of a cholecystectomy 09/2021 with a bile leak that required ERCP with stent placement.  She is referred today for evaluation of an abnormal CT scan of the chest.  She participates in lung cancer screening program and her most recent scan was done on 02/03/2023 as below. Today she reports that her breathing is ok. She has had episodes of bronchitis and has been on albuterol in the past - not recently. Occasional cough. She does have GERD  Lung cancer screening CT chest 02/03/2023 reviewed by me, shows no mediastinal or hilar adenopathy, 19 mm new right upper lobe pulmonary nodule with some peripheral groundglass attenuation, not present on 01/29/2022  ROV 03/07/2023 --follow-up visit for 62 year old woman with a history of tobacco use, hypothyroidism, prior cholecystectomy with bile leak.  She had an abnormal CT chest with a new 19 mm right upper lobe pulmonary nodule with some peripheral groundglass attenuation that were not seen 1 year prior.  We performed a PET scan and pulmonary function testing as below  PET scan 03/03/2023 reviewed by me, shows large-scale clearance of the 19 mm right upper lobe nodule noted on her CT from May.  Pulmonary function testing performed 02/24/2023 reviewed by me showed normal airflows without a bronchodilator response, normal lung volumes, normal diffusion capacity.  Review of Systems As per HPI  Past Medical History:  Diagnosis Date   Dyslipidemia    Hyperthyroidism      Family History  Problem Relation Age of Onset   Thyroid disease Son    Thyroid disease Mother    Lung cancer Mother    Thyroid disease Father    COPD Father    Diabetes Brother     Mother w lung CA.   Social History   Socioeconomic History   Marital status:  Married    Spouse name: Not on file   Number of children: Not on file   Years of education: Not on file   Highest education level: Not on file  Occupational History   Not on file  Tobacco Use   Smoking status: Heavy Smoker    Packs/day: 1    Types: Cigarettes   Smokeless tobacco: Never   Tobacco comments:    Quit smoking 1 month ago. ARJ 03/07/23  Substance and Sexual Activity   Alcohol use: Yes    Comment: occ   Drug use: No   Sexual activity: Not on file  Other Topics Concern   Not on file  Social History Narrative   Not on file   Social Determinants of Health   Financial Resource Strain: Not on file  Food Insecurity: Not on file  Transportation Needs: Not on file  Physical Activity: Not on file  Stress: Not on file  Social Connections: Not on file  Intimate Partner Violence: Not on file    She is active. 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   Allergies  Allergen Reactions   Atorvastatin Other (See Comments)    Muscle ache     Outpatient Medications Prior to Visit  Medication Sig Dispense Refill   calcium carbonate (TUMS EX) 750 MG chewable tablet Chew 1 tablet by mouth daily as needed for heartburn.  cholecalciferol (VITAMIN D3) 25 MCG (1000 UNIT) tablet Take 1,000 Units by mouth daily.     Lysine 1000 MG TABS Take 1,000 mg by mouth daily as needed (cold sores/fatigue).     methimazole (TAPAZOLE) 5 MG tablet 1 tablet Orally Once a day for 30 days     simvastatin (ZOCOR) 20 MG tablet Take 20 mg by mouth every evening.  3   vitamin C (ASCORBIC ACID) 500 MG tablet Take 500 mg by mouth daily.     zinc gluconate 50 MG tablet Take 50 mg by mouth daily.     acetaminophen (TYLENOL) 325 MG tablet Take 2 tablets (650 mg total) by mouth every 6 (six) hours as needed for mild pain (or temp > 100). (Patient not taking: Reported on 02/12/2023)     docusate sodium (COLACE) 100 MG capsule Take 1 capsule (100 mg total) by  mouth every 12 (twelve) hours. (Patient not taking: Reported on 02/12/2023) 60 capsule 0   ibuprofen (ADVIL) 200 MG tablet Take 400 mg by mouth every 8 (eight) hours as needed for moderate pain. (Patient not taking: Reported on 02/12/2023)     ondansetron (ZOFRAN-ODT) 4 MG disintegrating tablet Take 1 tablet (4 mg total) by mouth every 8 (eight) hours as needed for nausea or vomiting. (Patient not taking: Reported on 02/12/2023) 20 tablet 0   oxyCODONE (ROXICODONE) 5 MG immediate release tablet Take 1 tablet (5 mg total) by mouth every 6 (six) hours as needed. (Patient not taking: Reported on 02/12/2023) 10 tablet 0   polyethylene glycol (MIRALAX / GLYCOLAX) 17 g packet Take 17 g by mouth daily as needed for mild constipation. (Patient not taking: Reported on 02/12/2023) 14 each 0   Facility-Administered Medications Prior to Visit  Medication Dose Route Frequency Provider Last Rate Last Admin   sodium iodide (I-123) capsule 402.6 microcurie  402.6 microcurie Oral Once Jule Economy, MD              Objective:   Physical Exam  Vitals:   03/07/23 1403  BP: 126/74  Pulse: 85  Temp: 98.2 F (36.8 C)  TempSrc: Oral  SpO2: 95%  Weight: 197 lb 12.8 oz (89.7 kg)  Height: 5\' 2"  (1.575 m)    Gen: Pleasant, well-nourished, in no distress,  normal affect  ENT: No lesions,  mouth clear,  oropharynx clear, no postnasal drip  Neck: No JVD, no stridor  Lungs: No use of accessory muscles, no crackles or wheezing on normal respiration, no wheeze on forced expiration  Cardiovascular: RRR, heart sounds normal, no murmur or gallops, no peripheral edema  Musculoskeletal: No deformities, no cyanosis or clubbing  Neuro: alert, awake, non focal  Skin: Warm, no lesions or rash     Assessment & Plan:   Pulmonary nodule Pulmonary nodule in the right upper lobe noted on her lung cancer screening CT scan May/2024 has resolved on the subsequent PET scan.  Great news.  Discussed this with her.  She  can get back into the normal rotation for repeat lung cancer screening CT, next to be done in May 2025.  Her pulmonary function testing was normal so she would be a surgical candidate if this ever became relevant.  Tobacco use Continue lung cancer screening CTs as above.  She has stopped smoking.  Congratulated her on this.  Her pulmonary function testing was normal.  No indication for BD therapy at this time   Levy Pupa, MD, PhD 03/07/2023, 2:23 PM [facility]  [phone] or if no answer before 7:00PM call [phone] For any issues after 7:00PM please call eLink [phone]

## 2023-03-07 NOTE — Patient Instructions (Signed)
We reviewed your PET scan today.  This shows resolution of the right upper lobe pulmonary nodule that we were evaluating.  Good news. Your pulmonary function testing was normal. Congratulations on stopping smoking. You should get your next lung cancer screening CT scan of the chest in May 2025.  We will put in a reminder for this so that will be scheduled. Follow Dr. Delton Coombes if needed for any changes in your breathing or any other problems.

## 2023-03-07 NOTE — Assessment & Plan Note (Signed)
Continue lung cancer screening CTs as above.  She has stopped smoking.  Congratulated her on this.  Her pulmonary function testing was normal.  No indication for BD therapy at this time

## 2023-03-07 NOTE — Assessment & Plan Note (Signed)
Pulmonary nodule in the right upper lobe noted on her lung cancer screening CT scan May/2024 has resolved on the subsequent PET scan.  Great news.  Discussed this with her.  She can get back into the normal rotation for repeat lung cancer screening CT, next to be done in May 2025.  Her pulmonary function testing was normal so she would be a surgical candidate if this ever became relevant.

## 2023-03-11 ENCOUNTER — Ambulatory Visit: Payer: BC Managed Care – PPO | Admitting: Neurology

## 2023-03-11 ENCOUNTER — Encounter: Payer: Self-pay | Admitting: Neurology

## 2023-03-11 VITALS — BP 113/70 | HR 71 | Ht 62.0 in | Wt 197.0 lb

## 2023-03-11 DIAGNOSIS — R202 Paresthesia of skin: Secondary | ICD-10-CM

## 2023-03-11 NOTE — Progress Notes (Signed)
[facility] Clinic Note - Initial Visit   Date: 03/11/2023   Amy Cowan MRN: [id number] DOB: February 27, 1961   Dear Dr. Docia Chuck:  Thank you for your kind referral of Amy Cowan for consultation of right foot pain. Although her history is well known to you, please allow [place] to reiterate it for the purpose of our medical record. The patient was accompanied to the clinic by self.    Amy Cowan is a 62 y.o. right-handed female with hypertension presenting for evaluation of right foot burning pain.   IMPRESSION/PLAN: Right foot paresthesias involving the lateral foot, heel, and medial foot.  Symptoms initially suggestive of S1 radiculopathy, however I would not expect medial foot pain.  Tarsal tunnel syndrome is another consideration.  NCS/EMG of the right foot was offered. Patient opted to defer testing and would like to monitor.  If symptoms get worse, she may consider PT and/or medication such as gabapentin.   Return to clinic as needed  ------------------------------------------------------------- History of present illness: For the past year, she has noticed burning sensation over the right lateral foot, which occurs about 3 times per week, lasts a few minutes but can recur.  She denies low back pain, imbalance, or weakness. She has noticed that more recently, the burning is also involving the right heel and medial foot.  She does not have similar symptoms in the left foot.  Prolonged walking/standing can aggravate her symptoms.  She has not tried tylenol or NSAIDs.    She works as a Warehouse manager in an office. Nonsmoker.  Occasional alcohol.  Out-side paper records, electronic medical record, and images have been reviewed where available and summarized as:  Lab Results  Component Value Date   TSH 7.44 (H) 08/12/2016    Past Medical History:  Diagnosis Date   Dyslipidemia    Hyperthyroidism     Past Surgical History:  Procedure  Laterality Date   BILIARY STENT PLACEMENT N/A 10/21/2021   Procedure: BILIARY STENT PLACEMENT;  Surgeon: Kerin Salen, MD;  Location: [facility];  Service: Gastroenterology;  Laterality: N/A;   BIOPSY  11/30/2021   Procedure: BIOPSY;  Surgeon: Kerin Salen, MD;  Location: [facility];  Service: Gastroenterology;;   BUNIONECTOMY Right    ENDOSCOPIC RETROGRADE CHOLANGIOPANCREATOGRAPHY (ERCP) WITH PROPOFOL N/A 10/21/2021   Procedure: ENDOSCOPIC RETROGRADE CHOLANGIOPANCREATOGRAPHY (ERCP) WITH PROPOFOL;  Surgeon: Kerin Salen, MD;  Location: [facility];  Service: Gastroenterology;  Laterality: N/A;   ESOPHAGOGASTRODUODENOSCOPY (EGD) WITH PROPOFOL N/A 11/30/2021   Procedure: ESOPHAGOGASTRODUODENOSCOPY (EGD) WITH PROPOFOL;  Surgeon: Kerin Salen, MD;  Location: [facility];  Service: Gastroenterology;  Laterality: N/A;   GASTROINTESTINAL STENT REMOVAL N/A 11/30/2021   Procedure: GASTROINTESTINAL STENT REMOVAL;  Surgeon: Kerin Salen, MD;  Location: [facility];  Service: Gastroenterology;  Laterality: N/A;   REMOVAL OF STONES  10/21/2021   Procedure: REMOVAL OF SLUDGE;  Surgeon: Kerin Salen, MD;  Location: [facility];  Service: Gastroenterology;;   Dennison Mascot  10/21/2021   Procedure: Dennison Mascot;  Surgeon: Kerin Salen, MD;  Location: [facility];  Service: Gastroenterology;;     Medications:  Outpatient Encounter Medications as of 03/11/2023  Medication Sig   calcium carbonate (TUMS EX) 750 MG chewable tablet Chew 1 tablet by mouth daily as needed for heartburn.   cholecalciferol (VITAMIN D3) 25 MCG (1000 UNIT) tablet Take 1,000 Units by mouth daily.   Lysine 1000 MG TABS Take 1,000 mg by mouth daily as needed (cold sores/fatigue).   methimazole (TAPAZOLE) 5 MG tablet 1 tablet Orally  Once a day for 30 days   simvastatin (ZOCOR) 20 MG tablet Take 20 mg by mouth every evening.   vitamin C (ASCORBIC ACID) 500 MG tablet Take 500 mg by mouth daily.   zinc gluconate 50 MG tablet Take 50 mg by  mouth daily.   acetaminophen (TYLENOL) 325 MG tablet Take 2 tablets (650 mg total) by mouth every 6 (six) hours as needed for mild pain (or temp > 100). (Patient not taking: Reported on 02/12/2023)   docusate sodium (COLACE) 100 MG capsule Take 1 capsule (100 mg total) by mouth every 12 (twelve) hours. (Patient not taking: Reported on 02/12/2023)   ibuprofen (ADVIL) 200 MG tablet Take 400 mg by mouth every 8 (eight) hours as needed for moderate pain. (Patient not taking: Reported on 02/12/2023)   ondansetron (ZOFRAN-ODT) 4 MG disintegrating tablet Take 1 tablet (4 mg total) by mouth every 8 (eight) hours as needed for nausea or vomiting. (Patient not taking: Reported on 02/12/2023)   oxyCODONE (ROXICODONE) 5 MG immediate release tablet Take 1 tablet (5 mg total) by mouth every 6 (six) hours as needed. (Patient not taking: Reported on 02/12/2023)   polyethylene glycol (MIRALAX / GLYCOLAX) 17 g packet Take 17 g by mouth daily as needed for mild constipation. (Patient not taking: Reported on 02/12/2023)   Facility-Administered Encounter Medications as of 03/11/2023  Medication   sodium iodide (I-123) capsule 402.6 microcurie    Allergies:  Allergies  Allergen Reactions   Atorvastatin Other (See Comments)    Muscle ache    Family History: Family History  Problem Relation Age of Onset   Thyroid disease Mother    Lung cancer Mother    Thyroid disease Father    COPD Father    Diabetes Brother    Thyroid disease Son     Social History: Social History   Tobacco Use   Smoking status: Former    Packs/day: 1    Types: Cigarettes    Quit date: 01/22/2023    Years since quitting: 0.1   Smokeless tobacco: Never   Tobacco comments:    Quit smoking 1 month ago. ARJ 03/07/23  Substance Use Topics   Alcohol use: Yes    Comment: occ   Drug use: No   Social History   Social History Narrative   Right Handed    Lives in a one story home. Lives with husband       One child has passed away     Vital Signs:  BP 113/70   Pulse 71   Ht 5\' 2"  (1.575 m)   Wt 197 lb (89.4 kg)   LMP 07/10/2012   SpO2 95%   BMI 36.03 kg/m   Neurological Exam: MENTAL STATUS including orientation to time, place, person, recent and remote memory, attention span and concentration, language, and fund of knowledge is normal.  Speech is not dysarthric.  CRANIAL NERVES: II:  No visual field defects.     III-IV-VI: Pupils equal round and reactive to light.  Normal conjugate, extra-ocular eye movements in all directions of gaze.  No nystagmus.  No ptosis.   V:  Normal facial sensation.    VII:  Normal facial symmetry and movements.   VIII:  Normal hearing and vestibular function.   IX-X:  Normal palatal movement.   XI:  Normal shoulder shrug and head rotation.   XII:  Normal tongue strength and range of motion, no deviation or fasciculation.  MOTOR:  Motor strength is 5/5 throughout including dorsiflexion  and plantar flexion bilaterally. No atrophy, fasciculations or abnormal movements.  No pronator drift.   MSRs:                                           Right        Left brachioradialis 2+  2+  biceps 2+  2+  triceps 2+  2+  patellar 2+  2+  ankle jerk 2+  2+  Hoffman no  no  plantar response down  down   SENSORY:  Normal and symmetric perception of light touch, pinprick, vibration, and temperature.    COORDINATION/GAIT: Normal finger-to- nose-finger.  Intact rapid alternating movements bilaterally.  Gait narrow based and stable. Tandem and stressed gait intact.      Thank you for allowing me to participate in patient's care.  If I can answer any additional questions, I would be pleased to do so.    Sincerely,    Valeriano Bain K. Allena Katz, DO

## 2023-03-11 NOTE — Patient Instructions (Signed)
If you would like to do physical therapy, nerve testing, or a trial of nerve medication, please let me know.

## 2023-04-07 ENCOUNTER — Encounter (HOSPITAL_BASED_OUTPATIENT_CLINIC_OR_DEPARTMENT_OTHER): Payer: BC Managed Care – PPO

## 2023-04-10 ENCOUNTER — Ambulatory Visit: Payer: BC Managed Care – PPO | Admitting: Emergency Medicine

## 2023-05-05 DIAGNOSIS — E0590 Thyrotoxicosis, unspecified without thyrotoxic crisis or storm: Secondary | ICD-10-CM | POA: Diagnosis not present

## 2023-05-22 DIAGNOSIS — L308 Other specified dermatitis: Secondary | ICD-10-CM | POA: Diagnosis not present

## 2023-07-26 ENCOUNTER — Other Ambulatory Visit: Payer: Self-pay

## 2023-07-26 ENCOUNTER — Emergency Department (HOSPITAL_BASED_OUTPATIENT_CLINIC_OR_DEPARTMENT_OTHER)
Admission: EM | Admit: 2023-07-26 | Discharge: 2023-07-26 | Disposition: A | Discharge status: Home / Self Care | Payer: BC Managed Care – PPO | Attending: Emergency Medicine | Admitting: Emergency Medicine

## 2023-07-26 ENCOUNTER — Encounter (HOSPITAL_BASED_OUTPATIENT_CLINIC_OR_DEPARTMENT_OTHER): Payer: Self-pay

## 2023-07-26 ENCOUNTER — Emergency Department (HOSPITAL_BASED_OUTPATIENT_CLINIC_OR_DEPARTMENT_OTHER): Payer: BC Managed Care – PPO

## 2023-07-26 DIAGNOSIS — M7989 Other specified soft tissue disorders: Secondary | ICD-10-CM | POA: Diagnosis not present

## 2023-07-26 DIAGNOSIS — W1830XA Fall on same level, unspecified, initial encounter: Secondary | ICD-10-CM | POA: Diagnosis not present

## 2023-07-26 DIAGNOSIS — S8012XA Contusion of left lower leg, initial encounter: Secondary | ICD-10-CM | POA: Insufficient documentation

## 2023-07-26 DIAGNOSIS — M79662 Pain in left lower leg: Secondary | ICD-10-CM | POA: Diagnosis not present

## 2023-07-26 DIAGNOSIS — T148XXA Other injury of unspecified body region, initial encounter: Secondary | ICD-10-CM

## 2023-07-26 DIAGNOSIS — S8992XA Unspecified injury of left lower leg, initial encounter: Secondary | ICD-10-CM | POA: Diagnosis not present

## 2023-07-26 NOTE — ED Provider Notes (Signed)
[facility] Provider Note   CSN: [id number] Arrival date & time: 07/26/23  1159     History  Chief Complaint  Patient presents with   Fall   Leg Injury    Amy Cowan is a 62 y.o. female.  Pt with fall/contusion to left anterior lower leg ~ 1 week ago. Contusion and bruise to anterior left proximal tibial area then. Pt now notes some bruising down by ankle and so became concerned. Skin intact. No new or worsening pain to area of initial injury - that bruised area is turning yellowish. Leg leg or foot numbness/weakness. No calf pain or calf swelling. No chest pain or sob. No fevers.   The history is provided by the patient, the spouse and medical records.  Fall Pertinent negatives include no chest pain and no shortness of breath.       Home Medications Prior to Admission medications   Medication Sig Start Date End Date Taking? Authorizing Provider  acetaminophen (TYLENOL) 325 MG tablet Take 2 tablets (650 mg total) by mouth every 6 (six) hours as needed for mild pain (or temp > 100). Patient not taking: Reported on 02/12/2023 10/10/21   Carlena Bjornstad A, PA-C  calcium carbonate (TUMS EX) 750 MG chewable tablet Chew 1 tablet by mouth daily as needed for heartburn.    [provider]  cholecalciferol (VITAMIN D3) 25 MCG (1000 UNIT) tablet Take 1,000 Units by mouth daily.    [provider]  docusate sodium (COLACE) 100 MG capsule Take 1 capsule (100 mg total) by mouth every 12 (twelve) hours. Patient not taking: Reported on 02/12/2023 10/13/21   Tanda Rockers, PA-C  ibuprofen (ADVIL) 200 MG tablet Take 400 mg by mouth every 8 (eight) hours as needed for moderate pain. Patient not taking: Reported on 02/12/2023    [provider]  Lysine 1000 MG TABS Take 1,000 mg by mouth daily as needed (cold sores/fatigue).    [provider]  methimazole (TAPAZOLE) 5 MG tablet 1 tablet Orally Once a day for 30  days 03/05/23   [provider]  ondansetron (ZOFRAN-ODT) 4 MG disintegrating tablet Take 1 tablet (4 mg total) by mouth every 8 (eight) hours as needed for nausea or vomiting. Patient not taking: Reported on 02/12/2023 10/13/21   Tanda Rockers, PA-C  oxyCODONE (ROXICODONE) 5 MG immediate release tablet Take 1 tablet (5 mg total) by mouth every 6 (six) hours as needed. Patient not taking: Reported on 02/12/2023 10/22/21   Maczis, Elmer Sow, PA-C  polyethylene glycol (MIRALAX / GLYCOLAX) 17 g packet Take 17 g by mouth daily as needed for mild constipation. Patient not taking: Reported on 02/12/2023 10/10/21   Carlena Bjornstad A, PA-C  simvastatin (ZOCOR) 20 MG tablet Take 20 mg by mouth every evening. 07/26/18   [provider]  vitamin C (ASCORBIC ACID) 500 MG tablet Take 500 mg by mouth daily.    [provider]  zinc gluconate 50 MG tablet Take 50 mg by mouth daily.    [provider]      Allergies    Atorvastatin    Review of Systems   Review of Systems  Constitutional:  Negative for fever.  Respiratory:  Negative for shortness of breath.   Cardiovascular:  Negative for chest pain.  Musculoskeletal:        Contusion left leg.   Skin:  Negative for wound.  Neurological:  Negative for weakness and numbness.  Physical Exam Updated Vital Signs BP 117/75 (BP Location: Left Arm)   Pulse 78   Temp 97.8 F (36.6 C) (Oral)   Resp 20   Ht 1.575 m (5\' 2" )   Wt 90 kg   LMP 07/10/2012   SpO2 94%   BMI 36.29 kg/m  Physical Exam Vitals and nursing note reviewed.  Constitutional:      Appearance: Normal appearance. She is well-developed.  HENT:     Head: Atraumatic.  Neck:     Trachea: No tracheal deviation.  Cardiovascular:     Rate and Rhythm: Normal rate.     Pulses: Normal pulses.  Pulmonary:     Effort: Pulmonary effort is normal. No respiratory distress.  Musculoskeletal:     Cervical back: No muscular tenderness.     Comments: Mild  bruising/swelling to proximal anterior left lower leg. Compartments of lower leg are soft, not tense. No severe swelling noted. Pt with some bruising about ankle area. No malleolar tenderness or ankle or foot pain. No calf swelling or tenderness. Distal pulses palp.   Skin:    General: Skin is warm and dry.     Findings: No rash.  Neurological:     Mental Status: She is alert.     Comments: Alert, speech normal. Left lower leg and foot nvi.   Psychiatric:        Mood and Affect: Mood normal.     ED Results / Procedures / Treatments   Labs (all labs ordered are listed, but only abnormal results are displayed) Labs Reviewed - No data to display  EKG None  Radiology DG Tibia/Fibula Left  Result Date: 07/26/2023 CLINICAL DATA:  Following week ago with pain EXAM: LEFT TIBIA AND FIBULA - 2 VIEW COMPARISON:  None Available. FINDINGS: There is no evidence of fracture or other focal bone lesions. Soft tissue swelling about the ankle. IMPRESSION: No acute osseous abnormality Electronically Signed   By: Karen Kays M.D.   On: 07/26/2023 12:54    Procedures Procedures    Medications Ordered in ED Medications - No data to display  ED Course/ Medical Decision Making/ A&P                                 Medical Decision Making Problems Addressed: Bruising: acute illness or injury Contusion of left lower leg, initial encounter: acute illness or injury  Amount and/or Complexity of Data Reviewed Independent Historian: spouse    Details: hx External Data Reviewed: notes. Radiology: ordered and independent interpretation performed. Decision-making details documented in ED Course.   Imaging.   Reviewed nursing notes and prior charts for additional history.   Xrays reviewed/interpreted by me - no fx.  Progression of bruising to dependent area appears most c/w evolution of initial contusion/hematoma.   Pt currently appears stable for d/c.  Return precautions provided.          Final Clinical Impression(s) / ED Diagnoses Final diagnoses:  None    Rx / DC Orders ED Discharge Orders     None         Cathren Laine, MD 07/26/23 1336

## 2023-07-26 NOTE — Discharge Instructions (Addendum)
It was our pleasure to provide your ER care today - we hope that you feel better.  Your exam appears c/w evolution of the contusion/hematoma of the lower leg, whereby blood/bruising will move to skin surface of dependent areas.   Return to ER if worse, new symptoms, acutely worsening pain and swelling, fevers, or other concern.

## 2023-07-26 NOTE — ED Triage Notes (Signed)
The patient had a fall one week ago. She has swelling and bruising to her left lower leg. She stated that has gotten worse since last night.

## 2023-07-26 NOTE — ED Notes (Signed)

## 2023-09-04 DIAGNOSIS — E0590 Thyrotoxicosis, unspecified without thyrotoxic crisis or storm: Secondary | ICD-10-CM | POA: Diagnosis not present

## 2023-09-11 DIAGNOSIS — E0590 Thyrotoxicosis, unspecified without thyrotoxic crisis or storm: Secondary | ICD-10-CM | POA: Diagnosis not present

## 2023-10-02 DIAGNOSIS — H5213 Myopia, bilateral: Secondary | ICD-10-CM | POA: Diagnosis not present

## 2023-10-02 DIAGNOSIS — H10413 Chronic giant papillary conjunctivitis, bilateral: Secondary | ICD-10-CM | POA: Diagnosis not present

## 2023-11-11 DIAGNOSIS — E0590 Thyrotoxicosis, unspecified without thyrotoxic crisis or storm: Secondary | ICD-10-CM | POA: Diagnosis not present

## 2023-11-26 DIAGNOSIS — K219 Gastro-esophageal reflux disease without esophagitis: Secondary | ICD-10-CM | POA: Diagnosis not present

## 2023-12-01 DIAGNOSIS — R350 Frequency of micturition: Secondary | ICD-10-CM | POA: Diagnosis not present

## 2023-12-04 DIAGNOSIS — N898 Other specified noninflammatory disorders of vagina: Secondary | ICD-10-CM | POA: Diagnosis not present

## 2023-12-04 DIAGNOSIS — R350 Frequency of micturition: Secondary | ICD-10-CM | POA: Diagnosis not present

## 2023-12-29 DIAGNOSIS — E7800 Pure hypercholesterolemia, unspecified: Secondary | ICD-10-CM | POA: Diagnosis not present

## 2023-12-29 DIAGNOSIS — R7303 Prediabetes: Secondary | ICD-10-CM | POA: Diagnosis not present

## 2023-12-29 DIAGNOSIS — E0590 Thyrotoxicosis, unspecified without thyrotoxic crisis or storm: Secondary | ICD-10-CM | POA: Diagnosis not present

## 2023-12-29 DIAGNOSIS — Z0000 Encounter for general adult medical examination without abnormal findings: Secondary | ICD-10-CM | POA: Diagnosis not present

## 2024-01-01 DIAGNOSIS — Z1231 Encounter for screening mammogram for malignant neoplasm of breast: Secondary | ICD-10-CM | POA: Diagnosis not present

## 2024-01-01 DIAGNOSIS — Z130 Encounter for screening for diseases of the blood and blood-forming organs and certain disorders involving the immune mechanism: Secondary | ICD-10-CM | POA: Diagnosis not present

## 2024-01-01 DIAGNOSIS — Z01419 Encounter for gynecological examination (general) (routine) without abnormal findings: Secondary | ICD-10-CM | POA: Diagnosis not present

## 2024-02-25 ENCOUNTER — Other Ambulatory Visit: Payer: Self-pay | Admitting: Acute Care

## 2024-02-25 DIAGNOSIS — Z87891 Personal history of nicotine dependence: Secondary | ICD-10-CM

## 2024-02-25 DIAGNOSIS — F17210 Nicotine dependence, cigarettes, uncomplicated: Secondary | ICD-10-CM

## 2024-02-25 DIAGNOSIS — Z122 Encounter for screening for malignant neoplasm of respiratory organs: Secondary | ICD-10-CM

## 2024-03-11 DIAGNOSIS — E0590 Thyrotoxicosis, unspecified without thyrotoxic crisis or storm: Secondary | ICD-10-CM | POA: Diagnosis not present

## 2024-03-17 ENCOUNTER — Ambulatory Visit
Admission: RE | Admit: 2024-03-17 | Discharge: 2024-03-17 | Disposition: A | Discharge status: Home / Self Care | Source: Ambulatory Visit | Attending: Acute Care | Admitting: Acute Care

## 2024-03-17 DIAGNOSIS — Z87891 Personal history of nicotine dependence: Secondary | ICD-10-CM | POA: Diagnosis not present

## 2024-03-17 DIAGNOSIS — F17210 Nicotine dependence, cigarettes, uncomplicated: Secondary | ICD-10-CM

## 2024-03-17 DIAGNOSIS — Z122 Encounter for screening for malignant neoplasm of respiratory organs: Secondary | ICD-10-CM

## 2024-03-18 DIAGNOSIS — E0590 Thyrotoxicosis, unspecified without thyrotoxic crisis or storm: Secondary | ICD-10-CM | POA: Diagnosis not present

## 2024-03-25 ENCOUNTER — Other Ambulatory Visit: Payer: Self-pay | Admitting: Acute Care

## 2024-03-25 DIAGNOSIS — Z87891 Personal history of nicotine dependence: Secondary | ICD-10-CM

## 2024-03-25 DIAGNOSIS — Z122 Encounter for screening for malignant neoplasm of respiratory organs: Secondary | ICD-10-CM

## 2024-04-15 DIAGNOSIS — N8110 Cystocele, unspecified: Secondary | ICD-10-CM | POA: Diagnosis not present

## 2024-04-15 DIAGNOSIS — N951 Menopausal and female climacteric states: Secondary | ICD-10-CM | POA: Diagnosis not present

## 2024-04-20 DIAGNOSIS — N816 Rectocele: Secondary | ICD-10-CM | POA: Diagnosis not present

## 2024-04-20 DIAGNOSIS — N8111 Cystocele, midline: Secondary | ICD-10-CM | POA: Diagnosis not present

## 2024-04-21 DIAGNOSIS — M7989 Other specified soft tissue disorders: Secondary | ICD-10-CM | POA: Diagnosis not present

## 2024-09-20 DIAGNOSIS — E0590 Thyrotoxicosis, unspecified without thyrotoxic crisis or storm: Secondary | ICD-10-CM | POA: Diagnosis not present
